# Patient Record
Sex: Male | Born: 1985 | Race: Black or African American | Hispanic: No | Marital: Single | Smoking: Never smoker
Health system: Southern US, Community
[De-identification: ages and names within clinical notes are randomized; demographics above are authoritative.]

## PROBLEM LIST (undated history)

## (undated) DIAGNOSIS — E119 Type 2 diabetes mellitus without complications: Secondary | ICD-10-CM

## (undated) DIAGNOSIS — E785 Hyperlipidemia, unspecified: Secondary | ICD-10-CM

## (undated) HISTORY — PX: ANKLE ARTHROSCOPY: SHX545

## (undated) HISTORY — PX: ANKLE FRACTURE SURGERY: SHX122

## (undated) HISTORY — PX: WISDOM TOOTH EXTRACTION: SHX21

---
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
## 2005-03-02 ENCOUNTER — Emergency Department (HOSPITAL_COMMUNITY): Admission: EM | Admit: 2005-03-02 | Discharge: 2005-03-02 | Payer: Self-pay | Admitting: Emergency Medicine

## 2011-03-01 ENCOUNTER — Encounter: Payer: Self-pay | Admitting: *Deleted

## 2011-03-01 ENCOUNTER — Emergency Department (HOSPITAL_COMMUNITY): Payer: Self-pay

## 2011-03-01 ENCOUNTER — Inpatient Hospital Stay (HOSPITAL_COMMUNITY)
Admission: EM | Admit: 2011-03-01 | Discharge: 2011-03-04 | DRG: 494 | Disposition: A | Discharge status: Home / Self Care | Payer: Self-pay | Attending: Orthopaedic Surgery | Admitting: Orthopaedic Surgery

## 2011-03-01 DIAGNOSIS — W219XXA Striking against or struck by unspecified sports equipment, initial encounter: Secondary | ICD-10-CM

## 2011-03-01 DIAGNOSIS — S8263XA Displaced fracture of lateral malleolus of unspecified fibula, initial encounter for closed fracture: Principal | ICD-10-CM | POA: Diagnosis present

## 2011-03-01 DIAGNOSIS — Y9359 Activity, other involving other sports and athletics played individually: Secondary | ICD-10-CM

## 2011-03-01 DIAGNOSIS — S93429A Sprain of deltoid ligament of unspecified ankle, initial encounter: Secondary | ICD-10-CM | POA: Diagnosis present

## 2011-03-01 DIAGNOSIS — S82891A Other fracture of right lower leg, initial encounter for closed fracture: Secondary | ICD-10-CM

## 2011-03-01 LAB — BASIC METABOLIC PANEL
BUN: 11 mg/dL (ref 6–23)
CO2: 28 mEq/L (ref 19–32)
Calcium: 9.4 mg/dL (ref 8.4–10.5)
Chloride: 100 mEq/L (ref 96–112)
Creatinine, Ser: 1.32 mg/dL (ref 0.50–1.35)
GFR calc Af Amer: 85 mL/min — ABNORMAL LOW (ref >90)
GFR calc non Af Amer: 73 mL/min — ABNORMAL LOW (ref >90)
Glucose, Bld: 118 mg/dL — ABNORMAL HIGH (ref 70–99)
Potassium: 4 mEq/L (ref 3.5–5.1)
Sodium: 138 mEq/L (ref 135–145)

## 2011-03-01 LAB — CBC
HCT: 42.8 % (ref 39.0–52.0)
Hemoglobin: 14 g/dL (ref 13.0–17.0)
MCH: 28.3 pg (ref 26.0–34.0)
MCHC: 32.7 g/dL (ref 30.0–36.0)
MCV: 86.5 fL (ref 78.0–100.0)
Platelets: 336 10*3/uL (ref 150–400)
RBC: 4.95 MIL/uL (ref 4.22–5.81)
RDW: 13.6 % (ref 11.5–15.5)
WBC: 6.7 10*3/uL (ref 4.0–10.5)

## 2011-03-01 MED ORDER — NALOXONE HCL 0.4 MG/ML IJ SOLN: 0.4000 mg | INTRAMUSCULAR | Status: DC | PRN

## 2011-03-01 MED ORDER — FENTANYL CITRATE 0.05 MG/ML IJ SOLN: 200.0000 ug | Freq: Once | INTRAMUSCULAR | Status: DC

## 2011-03-01 MED ORDER — CEFAZOLIN SODIUM 1-5 GM-% IV SOLN
1.0000 g | Freq: Once | INTRAVENOUS | Status: DC
  Filled 2011-03-01: qty 50

## 2011-03-01 MED ORDER — PROPOFOL 10 MG/ML IV EMUL
INTRAVENOUS | Status: AC
  Filled 2011-03-01: qty 100

## 2011-03-01 MED ORDER — MIDAZOLAM HCL 5 MG/5ML IJ SOLN
5.0000 mg | Freq: Once | INTRAMUSCULAR | Status: AC
  Administered 2011-03-01: 5 mg via INTRAVENOUS

## 2011-03-01 MED ORDER — SODIUM CHLORIDE 0.9 % IV SOLN
Freq: Once | INTRAVENOUS | Status: AC
  Administered 2011-03-01: 20:00:00 via INTRAVENOUS

## 2011-03-01 MED ORDER — PROPOFOL 10 MG/ML IV EMUL: 200.0000 mg | Freq: Once | INTRAVENOUS | Status: DC

## 2011-03-01 MED ORDER — ONDANSETRON HCL 4 MG/2ML IJ SOLN: 4.0000 mg | Freq: Four times a day (QID) | INTRAMUSCULAR | Status: DC | PRN

## 2011-03-01 MED ORDER — ZOLPIDEM TARTRATE 5 MG PO TABS
5.0000 mg | ORAL_TABLET | Freq: Every day | ORAL | Status: DC
  Administered 2011-03-01 – 2011-03-03 (×3): 5 mg via ORAL
  Filled 2011-03-01 (×3): qty 1

## 2011-03-01 MED ORDER — MIDAZOLAM HCL 5 MG/5ML IJ SOLN
INTRAMUSCULAR | Status: AC
  Administered 2011-03-01: 5 mg
  Filled 2011-03-01: qty 10

## 2011-03-01 MED ORDER — PROPOFOL 10 MG/ML IV EMUL
INTRAVENOUS | Status: AC
  Filled 2011-03-01: qty 20

## 2011-03-01 MED ORDER — DIPHENHYDRAMINE HCL 50 MG/ML IJ SOLN
12.5000 mg | Freq: Four times a day (QID) | INTRAMUSCULAR | Status: DC | PRN
  Administered 2011-03-02: 12.5 mg via INTRAVENOUS
  Filled 2011-03-01: qty 1

## 2011-03-01 MED ORDER — FENTANYL CITRATE 0.05 MG/ML IJ SOLN
INTRAMUSCULAR | Status: AC
  Filled 2011-03-01: qty 2

## 2011-03-01 MED ORDER — MORPHINE SULFATE (PF) 1 MG/ML IV SOLN
INTRAVENOUS | Status: DC
  Administered 2011-03-01 – 2011-03-02 (×2): 25 mg via INTRAVENOUS
  Administered 2011-03-02: 6 mg via INTRAVENOUS
  Administered 2011-03-02: 1.5 mg via INTRAVENOUS
  Administered 2011-03-03: 1 mg via INTRAVENOUS
  Administered 2011-03-03 (×2): 25 mg via INTRAVENOUS
  Administered 2011-03-03: 5 mg via INTRAVENOUS
  Administered 2011-03-03: 11 mg via INTRAVENOUS
  Administered 2011-03-03: 5 mg via INTRAVENOUS
  Administered 2011-03-03: 6 mg via INTRAVENOUS
  Administered 2011-03-04: 4 mg via INTRAVENOUS
  Administered 2011-03-04: 2 mg via INTRAVENOUS
  Filled 2011-03-01 (×4): qty 25

## 2011-03-01 MED ORDER — DEXTROSE-NACL 5-0.45 % IV SOLN
INTRAVENOUS | Status: DC
  Administered 2011-03-01 – 2011-03-03 (×4): via INTRAVENOUS

## 2011-03-01 MED ORDER — FENTANYL CITRATE 0.05 MG/ML IJ SOLN
100.0000 ug | Freq: Once | INTRAMUSCULAR | Status: AC
  Administered 2011-03-01: 100 ug via INTRAVENOUS

## 2011-03-01 MED ORDER — MORPHINE SULFATE 10 MG/ML IJ SOLN
INTRAMUSCULAR | Status: AC
  Administered 2011-03-01: 5 mg
  Filled 2011-03-01: qty 1

## 2011-03-01 MED ORDER — DIPHENHYDRAMINE HCL 12.5 MG/5ML PO ELIX
12.5000 mg | ORAL_SOLUTION | Freq: Four times a day (QID) | ORAL | Status: DC | PRN
  Administered 2011-03-02: 12.5 mg via ORAL
  Filled 2011-03-01 (×2): qty 5

## 2011-03-01 MED ORDER — SODIUM CHLORIDE 0.9 % IJ SOLN: 9.0000 mL | INTRAMUSCULAR | Status: DC | PRN

## 2011-03-01 NOTE — ED Notes (Signed)
Patient able to hold conversation with family at this time . Vitals stable

## 2011-03-01 NOTE — ED Notes (Signed)
Per EMS - pt was playing basketball and landed on his right foot - at which time pt fell causing injury to rt ankle - pt w/ obvious deformity to rt ankle.

## 2011-03-01 NOTE — ED Notes (Signed)
Pt received IM dilaudid by EMS approx [phone]

## 2011-03-01 NOTE — ED Provider Notes (Signed)
Scribed for Raeford Razor, MD, the patient was seen in room APA01/AP[facility]01 . This chart was scribed by Ellie Lunch.   CSN: [id number] Arrival date & time: 03/01/2011  7:32 PM   First MD Initiated Contact with Patient 03/01/11 1935      Chief Complaint  Patient presents with  . Ankle Injury    (Consider location/radiation/quality/duration/timing/severity/associated sxs/prior treatment) HPI Bruce [name] is a 25 y.o. male brought in by ambulance, who presents to the Emergency Department complaining of ankle injury ~ one hour ago. Pt says he was placing basketball and landed on his right foot and fell. Pt reports some associated numbness in his right foot. Pain rated 10/10 in severity. Improved with dilaudid (given by EMS).  No other medical problems  No past medical history on file.  No past surgical history on file.  No family history on file.  History  Substance Use Topics  . Smoking status: Never Smoker   . Smokeless tobacco: Not on file  . Alcohol Use: No    Review of Systems 10 Systems reviewed and are negative for acute change except as noted in the HPI.  Allergies  Review of patient's allergies indicates no known allergies.  Home Medications  No current outpatient prescriptions on file.  BP 165/85  Temp(Src) 98.4 F (36.9 C) (Oral)  Resp 24  Ht 5\' 11"  (1.803 m)  Wt 240 lb (108.863 kg)  BMI 33.47 kg/m2  SpO2 100%  Physical Exam  Nursing note and vitals reviewed. Constitutional: He is oriented to person, place, and time. He appears well-developed and well-nourished.  HENT:  Head: Normocephalic and atraumatic.  Eyes: Conjunctivae are normal. No scleral icterus.  Neck: Neck supple.  Cardiovascular: Normal rate, regular rhythm and normal heart sounds.   Pulmonary/Chest: Effort normal and breath sounds normal. No respiratory distress.  Abdominal: Soft. There is no tenderness.  Musculoskeletal:       Obvious deformity right ankle. Injury closed.  Right foot  warm with good cap refill.  No doppler dpp pulse heard.   No proximal tenderness.    Neurological: He is alert and oriented to person, place, and time.  Skin: Skin is warm and dry.  Psychiatric: He has a normal mood and affect.    ED Course  Procedures (including critical care time)  OTHER DATA REVIEWED: Nursing notes, vital signs, and past medical records reviewed.   DIAGNOSTIC STUDIES: Oxygen Saturation is 100% on room air, normal by my interpretation.    Dg Ankle Right Port  03/01/2011  *RADIOLOGY REPORT*  Clinical Data: Basketball injury  PORTABLE RIGHT ANKLE - 2 VIEW  Comparison: None  Findings: Fracture dislocation of the ankle.  The talus is dislocated posteriorly.  There is a fracture of the distal fibula which is angulated.  Suboptimal views due to deformity however no definite fracture of the medial malleolus.  IMPRESSION: Fracture dislocation of the ankle.  Original Report Authenticated By: Camelia Phenes, M.D.   7:38 PM Dislocated ankle likely fracture. Will consult orthopedic surgeon for right ankle reduction.   EDP discussed conscious sedation and associated risks.  7:55 PM EDP consult with ortho Dr. Hilda Lias. Dr. Hilda Lias request callback after xray.   1. Ankle fracture, right       MDM  25yM with Fx/Disclocation R ankle. Evluated by ortho in ED who reduced and admitted.  I personally preformed the services scribed in my presence. The recorded information has been reviewed and considered. Raeford Razor, MD.  Raeford Razor, MD 03/10/11 [phone]

## 2011-03-01 NOTE — ED Notes (Signed)
Dr Hilda Lias  Refused to sign medical consent  Or surgical consent form. Stated he never signed one before and was not signing it.

## 2011-03-01 NOTE — ED Notes (Signed)
Noted gross deformity to right ankle. No palpable  pulses noted to foot via doppler

## 2011-03-01 NOTE — H&P (Signed)
Bruce [name] is an 25 y.o. male.   Chief Complaint: I hurt my ankle playing hard basketball HPI: He was playing basketball tonight in [place] and came down on his right ankle and felt a "pop" then severe pain and deformity.  He came here instead of [facility] in [place] as he lives in [place].  He has no other injury.  He is in marked pain.  He has a closed injury.  No past medical history on file.  No past surgical history on file.  No family history on file. Social History:  reports that he has never smoked. He does not have any smokeless tobacco history on file. He reports that he does not drink alcohol or use illicit drugs.  Allergies: No Known Allergies  Medications Prior to Admission  Medication Dose Route Frequency Provider Last Rate Last Dose  . 0.9 %  sodium chloride infusion   Intravenous Once Raeford Razor, MD 100 mL/hr at 03/01/11 2018    . fentaNYL (SUBLIMAZE) 0.05 MG/ML injection 100 mcg  100 mcg Intravenous Once Raeford Razor, MD   100 mcg at 03/01/11 2030  . fentaNYL (SUBLIMAZE) 0.05 MG/ML injection 200 mcg  200 mcg Intravenous Once Raeford Razor, MD      . midazolam (VERSED) 5 MG/5ML injection 5 mg  5 mg Intravenous Once Randie Bloodgood   5 mg at 03/01/11 2045  . midazolam (VERSED) 5 MG/5ML injection        5 mg at 03/01/11 2036  . morphine 10 MG/ML injection        5 mg at 03/01/11 2037  . propofol (DIPRIVAN) 10 MG/ML infusion 200 mg  200 mg Intravenous Once Raeford Razor, MD      . DISCONTD: fentaNYL (SUBLIMAZE) 0.05 MG/ML injection           . DISCONTD: propofol (DIPRIVAN) 10 MG/ML infusion           . DISCONTD: propofol (DIPRIVAN) 10 MG/ML infusion            No current outpatient prescriptions on file as of 03/01/2011.    Results for orders placed during the hospital encounter of 03/01/11 (from the past 48 hour(s))  CBC     Status: Normal   Collection Time   03/01/11  8:07 PM      Component Value Range Comment   WBC 6.7  4.0 - 10.5 (K/uL)    RBC 4.95  4.22 - 5.81  (MIL/uL)    Hemoglobin 14.0  13.0 - 17.0 (g/dL)    HCT 16.1  09.6 - 04.5 (%)    MCV 86.5  78.0 - 100.0 (fL)    MCH 28.3  26.0 - 34.0 (pg)    MCHC 32.7  30.0 - 36.0 (g/dL)    RDW 40.9  81.1 - 91.4 (%)    Platelets 336  150 - 400 (K/uL)    Dg Ankle Right Port  03/01/2011  *RADIOLOGY REPORT*  Clinical Data: Basketball injury  PORTABLE RIGHT ANKLE - 2 VIEW  Comparison: None  Findings: Fracture dislocation of the ankle.  The talus is dislocated posteriorly.  There is a fracture of the distal fibula which is angulated.  Suboptimal views due to deformity however no definite fracture of the medial malleolus.  IMPRESSION: Fracture dislocation of the ankle.  Original Report Authenticated By: Camelia Phenes, M.D.    Review of Systems  Constitutional: Negative.   HENT: Negative.   Eyes: Negative.   Respiratory: Negative.   Cardiovascular: Negative.   Gastrointestinal:  Negative.   Genitourinary: Negative.   Musculoskeletal: Positive for joint pain (severe pain right ankle tonight after playing basketball, no prior problems).  Skin: Negative.   Neurological: Negative.   Endo/Heme/Allergies: Negative.   Psychiatric/Behavioral: Negative.     Blood pressure 146/82, pulse 100, temperature 98.4 F (36.9 C), temperature source Oral, resp. rate 16, height 5\' 11"  (1.803 m), weight 108.863 kg (240 lb), SpO2 96.00%. Physical Exam  Constitutional: He is oriented to person, place, and time. He appears well-developed and well-nourished.  HENT:  Head: Normocephalic and atraumatic.  Eyes: Conjunctivae and EOM are normal. Pupils are equal, round, and reactive to light.  Neck: Normal range of motion. Neck supple.  Cardiovascular: Normal rate, regular rhythm, normal heart sounds and intact distal pulses.   Respiratory: Effort normal and breath sounds normal.  GI: Soft. Bowel sounds are normal.  Musculoskeletal: He exhibits tenderness (right ankle).       Right ankle: He exhibits decreased range of motion,  swelling, ecchymosis, deformity and abnormal pulse. tenderness. Lateral malleolus tenderness found.       Feet:  Neurological: He is alert and oriented to person, place, and time. He has normal reflexes.  Skin: Skin is warm and dry.  Psychiatric: He has a normal mood and affect. His behavior is normal. Judgment and thought content normal.     Assessment/Plan Fracture/dislocation of the right ankle with obvious deformity.  I gave him 5 mgm of Morphine IV and 10 mgm of Versed and then did a closed reduction of the right ankle.  He was aware of what needed to be done.  I have also told him he will need open treatment and internal fixation of the ankle tomorrow or the following day. He understood.  I went over this information before giving him any medication.  Closed reduction x-rays are pending.  Vinicius Brockman 03/01/2011, 8:47 PM

## 2011-03-01 NOTE — ED Notes (Signed)
Pt given meds and right ankle reduced by dr. Hilda Lias, pt tolerated well, snoring resp. vss.

## 2011-03-01 NOTE — ED Notes (Signed)
Alert alert, will respond to verbal stimuli, remains on all monitors, dr.keeling placed splint, awaiting xray for films.

## 2011-03-02 ENCOUNTER — Encounter (HOSPITAL_COMMUNITY): Payer: Self-pay | Admitting: Anesthesiology

## 2011-03-02 ENCOUNTER — Inpatient Hospital Stay (HOSPITAL_COMMUNITY): Payer: Self-pay | Admitting: Anesthesiology

## 2011-03-02 ENCOUNTER — Encounter (HOSPITAL_COMMUNITY): Payer: Self-pay | Admitting: *Deleted

## 2011-03-02 ENCOUNTER — Encounter (HOSPITAL_COMMUNITY)
Admission: EM | Disposition: A | Discharge status: Home / Self Care | Payer: Self-pay | Source: Home / Self Care | Attending: Orthopaedic Surgery

## 2011-03-02 ENCOUNTER — Inpatient Hospital Stay (HOSPITAL_COMMUNITY): Payer: Self-pay

## 2011-03-02 HISTORY — PX: ORIF ANKLE FRACTURE: SHX5408

## 2011-03-02 SURGERY — OPEN REDUCTION INTERNAL FIXATION (ORIF) ANKLE FRACTURE
Anesthesia: General | Site: Ankle | Laterality: Right | Wound class: Clean

## 2011-03-02 MED ORDER — LIDOCAINE HCL (PF) 1 % IJ SOLN
INTRAMUSCULAR | Status: AC
  Filled 2011-03-02: qty 5

## 2011-03-02 MED ORDER — SODIUM CHLORIDE 0.9 % IR SOLN
Status: DC | PRN
  Administered 2011-03-02: 1000 mL

## 2011-03-02 MED ORDER — ONDANSETRON HCL 4 MG/2ML IJ SOLN
INTRAMUSCULAR | Status: AC
  Filled 2011-03-02: qty 2

## 2011-03-02 MED ORDER — ENOXAPARIN SODIUM 40 MG/0.4ML ~~LOC~~ SOLN
40.0000 mg | SUBCUTANEOUS | Status: DC
  Administered 2011-03-03 – 2011-03-04 (×2): 40 mg via SUBCUTANEOUS
  Filled 2011-03-02 (×2): qty 0.4

## 2011-03-02 MED ORDER — MAGNESIUM HYDROXIDE 400 MG/5ML PO SUSP: 30.0000 mL | Freq: Every day | ORAL | Status: DC | PRN

## 2011-03-02 MED ORDER — PROMETHAZINE HCL 25 MG/ML IJ SOLN: 12.5000 mg | INTRAMUSCULAR | Status: DC | PRN

## 2011-03-02 MED ORDER — FENTANYL CITRATE 0.05 MG/ML IJ SOLN
25.0000 ug | INTRAMUSCULAR | Status: DC | PRN
  Administered 2011-03-02 (×2): 50 ug via INTRAVENOUS

## 2011-03-02 MED ORDER — FENTANYL CITRATE 0.05 MG/ML IJ SOLN
INTRAMUSCULAR | Status: DC | PRN
  Administered 2011-03-02: 50 ug via INTRAVENOUS
  Administered 2011-03-02: 25 ug via INTRAVENOUS
  Administered 2011-03-02: 50 ug via INTRAVENOUS
  Administered 2011-03-02: 25 ug via INTRAVENOUS

## 2011-03-02 MED ORDER — FENTANYL CITRATE 0.05 MG/ML IJ SOLN
INTRAMUSCULAR | Status: AC
  Administered 2011-03-02: 50 ug via INTRAVENOUS
  Filled 2011-03-02: qty 2

## 2011-03-02 MED ORDER — CEFAZOLIN SODIUM 1-5 GM-% IV SOLN
INTRAVENOUS | Status: DC | PRN
  Administered 2011-03-02: 1 g via INTRAVENOUS

## 2011-03-02 MED ORDER — MIDAZOLAM HCL 2 MG/2ML IJ SOLN
INTRAMUSCULAR | Status: AC
  Filled 2011-03-02: qty 2

## 2011-03-02 MED ORDER — LACTATED RINGERS IV SOLN
INTRAVENOUS | Status: DC
  Administered 2011-03-02 (×2): via INTRAVENOUS

## 2011-03-02 MED ORDER — ONDANSETRON HCL 4 MG/2ML IJ SOLN: 4.0000 mg | Freq: Once | INTRAMUSCULAR | Status: DC | PRN

## 2011-03-02 MED ORDER — ACETAMINOPHEN 325 MG PO TABS
650.0000 mg | ORAL_TABLET | Freq: Four times a day (QID) | ORAL | Status: DC | PRN
  Administered 2011-03-02: 650 mg via ORAL
  Filled 2011-03-02: qty 2

## 2011-03-02 MED ORDER — PROPOFOL 10 MG/ML IV EMUL
INTRAVENOUS | Status: DC | PRN
  Administered 2011-03-02: 70 mg via INTRAVENOUS
  Administered 2011-03-02: 180 mg via INTRAVENOUS

## 2011-03-02 MED ORDER — PROPOFOL 10 MG/ML IV EMUL
INTRAVENOUS | Status: AC
  Filled 2011-03-02: qty 20

## 2011-03-02 MED ORDER — FENTANYL CITRATE 0.05 MG/ML IJ SOLN
INTRAMUSCULAR | Status: AC
  Filled 2011-03-02: qty 2

## 2011-03-02 MED ORDER — ONDANSETRON HCL 4 MG/2ML IJ SOLN
4.0000 mg | Freq: Once | INTRAMUSCULAR | Status: AC
  Administered 2011-03-02: 4 mg via INTRAVENOUS

## 2011-03-02 MED ORDER — MIDAZOLAM HCL 5 MG/5ML IJ SOLN
INTRAMUSCULAR | Status: DC | PRN
  Administered 2011-03-02: 2 mg via INTRAVENOUS

## 2011-03-02 MED ORDER — MIDAZOLAM HCL 2 MG/2ML IJ SOLN
1.0000 mg | INTRAMUSCULAR | Status: DC | PRN
  Administered 2011-03-02: 2 mg via INTRAVENOUS

## 2011-03-02 SURGICAL SUPPLY — 59 items
BAG HAMPER (MISCELLANEOUS) ×2 IMPLANT
BANDAGE ELASTIC 4 VELCRO NS (GAUZE/BANDAGES/DRESSINGS) ×2 IMPLANT
BANDAGE ELASTIC 4 VELCRO ST LF (GAUZE/BANDAGES/DRESSINGS) ×2 IMPLANT
BANDAGE ELASTIC 6 VELCRO NS (GAUZE/BANDAGES/DRESSINGS) ×2 IMPLANT
BANDAGE ELASTIC 6 VELCRO ST LF (GAUZE/BANDAGES/DRESSINGS) ×4 IMPLANT
BANDAGE ESMARK 4X12 BL STRL LF (DISPOSABLE) ×1 IMPLANT
BIT DRILL 2.5X110 QC LCP DISP (BIT) ×2 IMPLANT
BIT DRILL 2.8 (BIT) ×1
BIT DRILL CANN QC 2.8X165 (BIT) ×1 IMPLANT
BIT DRILL JC END 3.2X130 (BIT) IMPLANT
BLADE SURG 15 STRL LF DISP TIS (BLADE) ×1 IMPLANT
BLADE SURG 15 STRL SS (BLADE) ×1
BNDG ESMARK 4X12 BLUE STRL LF (DISPOSABLE) ×2
CLOTH BEACON ORANGE TIMEOUT ST (SAFETY) ×2 IMPLANT
COVER LIGHT HANDLE STERIS (MISCELLANEOUS) ×4 IMPLANT
COVER MAYO STAND XLG (DRAPE) ×2 IMPLANT
CUFF TOURNIQUET SINGLE 34IN LL (TOURNIQUET CUFF) ×2 IMPLANT
DRAPE C-ARM FOLDED MOBILE STRL (DRAPES) ×2 IMPLANT
DRILL BIT 2.8MM (BIT) ×1
GAUZE XEROFORM 5X9 LF (GAUZE/BANDAGES/DRESSINGS) ×2 IMPLANT
GLOVE BIO SURGEON STRL SZ8 (GLOVE) ×2 IMPLANT
GLOVE BIO SURGEON STRL SZ8.5 (GLOVE) ×2 IMPLANT
GLOVE ECLIPSE 6.5 STRL STRAW (GLOVE) ×2 IMPLANT
GLOVE ECLIPSE 7.0 STRL STRAW (GLOVE) ×2 IMPLANT
GLOVE EXAM NITRILE XS STR PU (GLOVE) ×2 IMPLANT
GLOVE INDICATOR 7.0 STRL GRN (GLOVE) ×4 IMPLANT
GOWN BRE IMP SLV AUR XL STRL (GOWN DISPOSABLE) ×4 IMPLANT
GOWN STRL REIN XL XLG (GOWN DISPOSABLE) ×2 IMPLANT
INST SET MINOR BONE (KITS) ×2 IMPLANT
K-WIRE 229MX1.6 (WIRE) IMPLANT
KIT ROOM TURNOVER APOR (KITS) ×2 IMPLANT
MANIFOLD NEPTUNE II (INSTRUMENTS) ×2 IMPLANT
NS IRRIG 1000ML POUR BTL (IV SOLUTION) ×2 IMPLANT
PACK BASIC LIMB (CUSTOM PROCEDURE TRAY) ×2 IMPLANT
PAD ABD 5X9 TENDERSORB (GAUZE/BANDAGES/DRESSINGS) ×6 IMPLANT
PAD ARMBOARD 7.5X6 YLW CONV (MISCELLANEOUS) ×2 IMPLANT
PAD CAST 4YDX4 CTTN HI CHSV (CAST SUPPLIES) ×1 IMPLANT
PADDING CAST COTTON 4X4 STRL (CAST SUPPLIES) ×1
PADDING CAST COTTON 6X4 STRL (CAST SUPPLIES) ×2 IMPLANT
PLATE LCP 3.5 1/3 TUB 8HX93 (Plate) ×2 IMPLANT
SCREW CORTEX 3.5 16MM (Screw) ×4 IMPLANT
SCREW CORTEX 3.5 20MM (Screw) ×1 IMPLANT
SCREW CORTEX 3.5 55MM (Screw) ×2 IMPLANT
SCREW LOCK CORT ST 3.5X16 (Screw) ×4 IMPLANT
SCREW LOCK CORT ST 3.5X20 (Screw) ×1 IMPLANT
SCREW LOCK T15 FT 16X3.5X2.9X (Screw) ×1 IMPLANT
SCREW LOCKING 3.5X16 (Screw) ×1 IMPLANT
SET BASIN LINEN APH (SET/KITS/TRAYS/PACK) ×2 IMPLANT
SOL PREP PROV IODINE SCRUB 4OZ (MISCELLANEOUS) ×2 IMPLANT
SPLINT J 5 (CAST SUPPLIES) IMPLANT
SPLINT J 6 (CAST SUPPLIES) ×2 IMPLANT
SPONGE GAUZE 4X4 12PLY (GAUZE/BANDAGES/DRESSINGS) ×2 IMPLANT
SPONGE LAP 18X18 X RAY DECT (DISPOSABLE) ×2 IMPLANT
SPONGE LAP 4X18 X RAY DECT (DISPOSABLE) ×2 IMPLANT
STAPLER VISISTAT 35W (STAPLE) IMPLANT
SUT CHROMIC 2 0 CT 1 (SUTURE) ×2 IMPLANT
SUT NUROLON CT 2 BLK #1 18IN (SUTURE) ×4 IMPLANT
SUT PLAIN 2 0 XLH (SUTURE) ×4 IMPLANT
TOWEL OR 17X26 4PK STRL BLUE (TOWEL DISPOSABLE) ×2 IMPLANT

## 2011-03-02 NOTE — Anesthesia Procedure Notes (Addendum)
Procedure Name: LMA Insertion Date/Time: 03/02/2011 9:46 AM Performed by: Despina Hidden Pre-anesthesia Checklist: Patient identified, Patient being monitored, Timeout performed, Emergency Drugs available and Suction available Patient Re-evaluated:Patient Re-evaluated prior to inductionOxygen Delivery Method: Circle System Utilized Preoxygenation: Pre-oxygenation with 100% oxygen Intubation Type: IV induction Ventilation: Mask ventilation without difficulty LMA: LMA inserted LMA Size: 4.0 Number of attempts: 1 Placement Confirmation: positive ETCO2 and breath sounds checked- equal and bilateral Dental Injury: Teeth and Oropharynx as per pre-operative assessment

## 2011-03-02 NOTE — Brief Op Note (Signed)
03/01/2011 - 03/02/2011  11:03 AM  PATIENT:  Bruce [name]  25 y.o. male  PRE-OPERATIVE DIAGNOSIS:  rt ankle dislocation with fracture lateral malleolus and disruption of the medial deltoid ligament and the syndesmosis ligament between fibula and tibia distally  POST-OPERATIVE DIAGNOSIS:  rt ankle dislocation  PROCEDURE:  Procedure(s): OPEN REDUCTION INTERNAL FIXATION (ORIF) ANKLE FRACTURE with repair of the deltoid ligament medially and fixation of the lateral malleolus and placement of syndesmosis screw, STAGED PROCEDURE  SURGEON:  Surgeon(s): [employer]  PHYSICIAN ASSISTANT:   ASSISTANTS: none   ANESTHESIA:   general  EBL:  Total I/O In: 1000 [I.V.:1000] Out: -   BLOOD ADMINISTERED:none  DRAINS: none   LOCAL MEDICATIONS USED:  NONE  SPECIMEN:  No Specimen  DISPOSITION OF SPECIMEN:  N/A  COUNTS:  YES  TOURNIQUET:   Total Tourniquet Time Documented: Thigh (Right) - 54 minutes  DICTATION: .Other Dictation: Dictation Number [id number]  PLAN OF CARE: Admit to inpatient   PATIENT DISPOSITION:  PACU - hemodynamically stable.   Delay start of Pharmacological VTE agent (>24hrs) due to surgical blood loss or risk of bleeding:  no

## 2011-03-02 NOTE — Anesthesia Preprocedure Evaluation (Addendum)
Anesthesia Evaluation  Patient identified by MRN, date of birth, ID band Patient awake  General Assessment Comment  Reviewed: Allergy & Precautions, H&P , NPO status , Patient's Chart, lab work & pertinent test results  Airway Mallampati: I      Dental  (+) Teeth Intact   Pulmonary  clear to auscultation        Cardiovascular Regular Normal    Neuro/Psych    GI/Hepatic   Endo/Other    Renal/GU      Musculoskeletal   Abdominal   Peds  Hematology   Anesthesia Other Findings   Reproductive/Obstetrics                           Anesthesia Physical Anesthesia Plan  ASA: I  Anesthesia Plan: General   Post-op Pain Management:    Induction: Intravenous  Airway Management Planned: LMA  Additional Equipment:   Intra-op Plan:   Post-operative Plan: Extubation in OR  Informed Consent: I have reviewed the patients History and Physical, chart, labs and discussed the procedure including the risks, benefits and alternatives for the proposed anesthesia with the patient or authorized representative who has indicated his/her understanding and acceptance.     Plan Discussed with:   Anesthesia Plan Comments:         Anesthesia Quick Evaluation

## 2011-03-02 NOTE — Transfer of Care (Signed)
Immediate Anesthesia Transfer of Care Note  Patient: Bruce [name]  Procedure(s) Performed:  OPEN REDUCTION INTERNAL FIXATION (ORIF) ANKLE FRACTURE  Patient Location: PACU  Anesthesia Type: General  Level of Consciousness: awake, alert , oriented and patient cooperative  Airway & Oxygen Therapy: Patient Spontanous Breathing and Patient connected to face mask oxygen  Post-op Assessment: Report given to PACU RN, Post -op Vital signs reviewed and stable, Patient moving all extremities and Patient moving all extremities X 4  Post vital signs: Reviewed and stable  Complications: No apparent anesthesia complications

## 2011-03-02 NOTE — Plan of Care (Signed)
Problem: Phase I Progression Outcomes Goal: Pain controlled with appropriate interventions Outcome: Completed/Met Date Met:  03/02/11 Morphine PCA

## 2011-03-02 NOTE — Progress Notes (Signed)
The History and Physical is unchanged. I have examined the patient. The patient is medically able to have surgery on the right ankle . Bruce [name] 

## 2011-03-02 NOTE — OR Nursing (Signed)
DISCARDED 800CC   d5 1/2, REPLACED WITH 1000CC lr

## 2011-03-02 NOTE — Op Note (Signed)
NAME:  Bruce [name], Bruce [name] NO.:  [id number]  MEDICAL RECORD NO.:  [id number]  LOCATION:  A316                          FACILITY:  [facility]  PHYSICIAN:  J. Darreld Mclean, M.D. DATE OF BIRTH:  23-Sep-1985  DATE OF PROCEDURE:  03/02/2011 DATE OF DISCHARGE:                              OPERATIVE REPORT   PREOPERATIVE DIAGNOSES:  Status post  dislocation of the right ankle with disruption of the deltoid ligament medially, the syndesmosis ligament between the tibia and fibula and lateral malleolar and distal fibula fracture.  POSTOPERATIVE DIAGNOSES:  Status post  dislocation of the right ankle with disruption of the deltoid ligament medially, the syndesmosis ligament between the tibia and fibula and lateral malleolar and distal fibula fracture.  PROCEDURE:  Open treatment and internal reduction of right ankle with repair of the deltoid ligament medially, placement of 8-hole lateral malleolar screw with syndesmosis screw.  This is a staged procedure.  He will need to come back in several weeks for removal of the syndesmosis screw.  ANESTHESIA:  General.  TOURNIQUET TIME:  55 minutes.  DRAINS:  None.  Posterior splint applied at the end of the procedure.  SURGEON:  J. Darreld Mclean, MD  INDICATIONS:  The patient is a 25 year old male who injured his ankle last night.  I saw him in the ER and did a closed reduction of the ankle dislocation last night.  I talked to him about coming to surgery today. I went over the risks and imponderables including infection, nerve injury, strong possibility to develop overtime some traumatic arthritis of the ankle.  I explained to him about the placement of the syndesmosis screw and the need to stand for approximately 8 weeks and he have to come back for surgery and remove the screw before he starts bearing weight.  I talked about physical therapy after that.  I talked to him about anesthesia and recommended general anesthesia.  He  asked appropriate questions and appeared to understand the procedure as outlined.  DESCRIPTION OF PROCEDURE:  The patient was seen in the holding area. His mother was also present.  I had them ask questions which they did. The site was identified in the holding area as the right ankle and I placed a mark on the right ankle.  The patient was brought to the operating room, placed supine, and given general anesthesia.  Tourniquet placed and deflated, right upper thigh.  Posterior splint was removed and he was prepped and draped in the usual manner.  We had a generalized time-out identifying the patient as Bruce [name] and his right ankle.  All people in the operating room knew each other.  C- arm fluoroscopy unit was in place.  Everyone had lead aprons, thyroid shields, and appropriate radiology indicators.  C-arm fluoroscopy unit was working properly.  The leg was then elevated up circumferentially with Esmarch bandage, tourniquet inflated to 350 mmHg, and Esmarch bandage removed.  Incision was made medially, the joint was exposed, lifted the talus but I did not see an obvious fracture.  I debrided the hematoma.  The deep fibers of the deltoid ligament were torn and also the ligament somewhat anteriorly with the talus and  tibia joint, and I used 0-Surgilon suture and I placed sutures in figure-of-eight fashion, approximately 10 of them around to repair the ligament.  Good repair was obtained as I tied the sutures at this time, however.  I then went laterally, exposed the fracture, cleaned the hematoma, had reduction, and placed an 8-hole sideplate.  Screws were cortical except for one locking screw, the fourth screw from proximal did not have anything to place and excess over the fracture site.  C-arm fluoroscopy unit was used.  Screws generally measured around 16 mm, there was 120 mm, there was 155 mm for the syndesmosis screw, and a locking screw of 16. Primary x-rays were taken and these  looked good.  The ankle mortise was reduced.  I went over to the medial side and tied the previously applied sutures to the ligament sutured in place and the wounds were reapproximated with 2-0 plain subcutaneous layer and skin staples. Likewise, the repair was done on the lateral side.  Tourniquet deflated after 53 minutes.  Bulky dressing applied, sterile dressing applied, and then a posterior splint applied.  He tolerated the procedure well and will go to recovery in good condition.  He will be admitted for pain control and then physical therapy.          ______________________________ J. Darreld Mclean, M.D.     JWK/MEDQ  D:  03/02/2011  T:  03/02/2011  Job:  [id number]

## 2011-03-02 NOTE — Anesthesia Postprocedure Evaluation (Signed)
  Anesthesia Post-op Note  Patient: Gene Glazebrook  Procedure(s) Performed:  OPEN REDUCTION INTERNAL FIXATION (ORIF) ANKLE FRACTURE  Patient Location: PACU  Anesthesia Type: General  Level of Consciousness: awake, alert , oriented and patient cooperative  Airway and Oxygen Therapy: Patient Spontanous Breathing  Post-op Pain: 3 /10, mild  Post-op Assessment: Post-op Vital signs reviewed, Patient's Cardiovascular Status Stable, Respiratory Function Stable, Patent Airway and No signs of Nausea or vomiting  Post-op Vital Signs: Reviewed and stable  Complications: No apparent anesthesia complications

## 2011-03-02 NOTE — OR Nursing (Signed)
Voided 250 cc yellow   urine 

## 2011-03-03 LAB — CBC
MCH: 28.6 pg (ref 26.0–34.0)
Platelets: 338 10*3/uL (ref 150–400)
RBC: 4.9 MIL/uL (ref 4.22–5.81)
WBC: 7.9 10*3/uL (ref 4.0–10.5)

## 2011-03-03 LAB — BASIC METABOLIC PANEL
Calcium: 9.4 mg/dL (ref 8.4–10.5)
GFR calc non Af Amer: 78 mL/min — ABNORMAL LOW (ref >90)
Sodium: 136 mEq/L (ref 135–145)

## 2011-03-03 LAB — DIFFERENTIAL
Eosinophils Absolute: 0 10*3/uL (ref 0.0–0.7)
Lymphocytes Relative: 23 % (ref 12–46)
Lymphs Abs: 1.8 10*3/uL (ref 0.7–4.0)
Neutrophils Relative %: 64 % (ref 43–77)

## 2011-03-03 MED ORDER — ALBUTEROL SULFATE (5 MG/ML) 0.5% IN NEBU: 2.5000 mg | INHALATION_SOLUTION | RESPIRATORY_TRACT | Status: DC | PRN

## 2011-03-03 MED ORDER — ALBUTEROL SULFATE (5 MG/ML) 0.5% IN NEBU
2.5000 mg | INHALATION_SOLUTION | Freq: Four times a day (QID) | RESPIRATORY_TRACT | Status: DC
  Administered 2011-03-03 (×2): 2.5 mg via RESPIRATORY_TRACT
  Filled 2011-03-03 (×2): qty 0.5

## 2011-03-03 NOTE — Anesthesia Postprocedure Evaluation (Signed)
  Anesthesia Post-op Note  Patient: Bruce [name]  Procedure(s) Performed:  OPEN REDUCTION INTERNAL FIXATION (ORIF) ANKLE FRACTURE  Patient Location: room 316  Anesthesia Type: General  Level of Consciousness: awake, alert , oriented and patient cooperative  Airway and Oxygen Therapy: Patient Spontanous Breathing  Post-op Pain: 2 /10, mild  Post-op Assessment: Post-op Vital signs reviewed, Patient's Cardiovascular Status Stable, Respiratory Function Stable, Patent Airway, No signs of Nausea or vomiting, Adequate PO intake and Pain level controlled  Post-op Vital Signs: Reviewed and stable  Complications: No apparent anesthesia complications

## 2011-03-03 NOTE — Progress Notes (Signed)
Tmax was 101.2.  He is afebrile now.  WBC count not elevated.  Labs normal.  NV is intact.  Pain controlled.  He has no complaints.  To begin physical therapy today.  I will add respiratory therapy and nebulizer treatments today.

## 2011-03-03 NOTE — Addendum Note (Signed)
Addendum  created 03/03/11 1125 by Despina Hidden   Modules edited:Notes Section

## 2011-03-03 NOTE — Progress Notes (Signed)
Physical Therapy Evaluation Patient Details Name: Bruce [name] MRN: [id number] DOB: 1986/03/15 Today's Date: 03/03/2011  Problem List: There is no problem list on file for this patient.   Past Medical History: History reviewed. No pertinent past medical history. Past Surgical History:  Past Surgical History  Procedure Date  . Wisdom tooth extraction     PT Assessment/Plan/Recommendation PT Assessment Clinical Impression Statement: very pleasant and cooperative pt, fitted with crutches and instructed in R TTWB gait on level...needs min assist to maintain gait safety due to occaional instability ...have recommended that he stay with his Mom after d/c as she has no steps at home and he will also have more assist from her than a roommate...  will plan on seeing 2x/day and instruct in stair climbing in AM          PT Recommendation/Assessment: Patient will need skilled PT in the acute care venue PT Problem List: Decreased knowledge of use of DME;Decreased safety awareness;Decreased knowledge of precautions;Pain;Decreased activity tolerance;Decreased mobility Barriers to Discharge: None PT Plan PT Frequency: Min 6X/week PT Treatment/Interventions: DME instruction;Gait training;Stair training;Patient/family education PT Recommendation Follow Up Recommendations: None Equipment Recommended: None recommended by PT PT Goals  Acute Rehab PT Goals PT Goal Formulation: With patient Time For Goal Achievement: 7 days Pt will go Supine/Side to Sit: with modified independence Pt will go Sit to Supine/Side: with modified independence;with mod assist Pt will Ambulate: 16 - 50 feet Pt will Go Up / Down Stairs: 3-5 stairs;with supervision;with crutches;with rail(s)  PT Evaluation Precautions/Restrictions  Precautions Precautions: Fall Required Braces or Orthoses: No Restrictions Weight Bearing Restrictions: Yes RLE Weight Bearing: Touchdown weight bearing Prior Functioning  Home Living Lives  With: Friend(s);Family (will stay with Mom at d/c) Receives Help From: Family Type of Home: House Home Layout: One level Home Access: Level entry Bathroom Shower/Tub: Engineer, manufacturing systems: Standard Bathroom Accessibility: Yes Home Adaptive Equipment: None Prior Function Level of Independence: Independent with basic ADLs;Independent with gait;Independent with homemaking with ambulation;Independent with transfers Driving: Yes Vocation: Student Cognition Cognition Arousal/Alertness: Awake/alert Overall Cognitive Status: Appears within functional limits for tasks assessed Orientation Level: Oriented X4 Sensation/Coordination Sensation Light Touch: Appears Intact Stereognosis: Not tested Hot/Cold: Not tested Proprioception: Not tested Extremity Assessment RUE Assessment RUE Assessment: Within Functional Limits LUE Assessment LUE Assessment: Within Functional Limits RLE Assessment RLE Assessment: Not tested LLE Assessment LLE Assessment: Within Functional Limits Mobility (including Balance) Bed Mobility Bed Mobility: Yes Supine to Sit: 4: Min assist Supine to Sit Details (indicate cue type and reason): to assist RLE...cues for using hip musculature to do R SLR Sitting - Scoot to Edge of Bed: 6: Modified independent (Device/Increase time) Transfers Transfers: Yes Sit to Stand: 6: Modified independent (Device/Increase time) Stand to Sit: 6: Modified independent (Device/Increase time) Ambulation/Gait Ambulation/Gait: Yes Ambulation/Gait Assistance: 4: Min assist Ambulation/Gait Assistance Details (indicate cue type and reason): instruction for correct gait technique...mostly does NWB gait R Ambulation Distance (Feet): 40 Feet Assistive device: Crutches Gait Pattern: Step-through pattern;Within Functional Limits Stairs: No Wheelchair Mobility Wheelchair Mobility: No  Posture/Postural Control Posture/Postural Control: No significant limitations Balance Balance  Assessed: No Exercise    End of Session PT - End of Session Equipment Utilized During Treatment: Gait belt Activity Tolerance: Patient tolerated treatment well Patient left: in bed;with call bell in reach General Behavior During Session: [facility] for tasks performed Cognition: [facility] for tasks performed  Konrad Penta 03/03/2011, 11:05 AM

## 2011-03-03 NOTE — Progress Notes (Signed)
Physical Therapy Treatment Patient Details Name: Bruce [name] MRN: [id number] DOB: 05/11/1985 Today's Date: 03/03/2011  PT Assessment/Plan  PT - Assessment/Plan Comments on Treatment Session: pt more fatigued..received instruction in correct crutch technique and TTWB gait...endurance is limited but should be able to instruct on steps in AM PT Plan: Discharge plan remains appropriate PT Frequency: Min 6X/week Follow Up Recommendations: None Equipment Recommended: None recommended by PT PT Goals  Acute Rehab PT Goals PT Goal Formulation: With patient Time For Goal Achievement: 7 days Pt will go Supine/Side to Sit: with modified independence Pt will go Sit to Supine/Side: with modified independence;with mod assist Pt will Ambulate: 16 - 50 feet PT Goal: Ambulate - Progress: Progressing toward goal Pt will Go Up / Down Stairs: 3-5 stairs;with supervision;with crutches;with rail(s)  PT Treatment Precautions/Restrictions  Precautions Precautions: Fall Required Braces or Orthoses: No Restrictions Weight Bearing Restrictions: Yes RLE Weight Bearing: Touchdown weight bearing Mobility (including Balance) Bed Mobility Bed Mobility: Yes Supine to Sit: 4: Min assist Supine to Sit Details (indicate cue type and reason): to assist RLE...cues for using hip musculature to do R SLR Sitting - Scoot to Edge of Bed: 6: Modified independent (Device/Increase time) Transfers Transfers: Yes Sit to Stand: 5: Supervision Stand to Sit: 5: Supervision Ambulation/Gait Ambulation/Gait: Yes Ambulation/Gait Assistance: 4: Min assist Ambulation/Gait Assistance Details (indicate cue type and reason): instruction for correct gait technique...mostly does NWB gait R Ambulation Distance (Feet): 20 Feet Assistive device: Crutches Gait Pattern:  (unable to walk TTWB-does NWB R) Stairs: No Wheelchair Mobility Wheelchair Mobility: No  Posture/Postural Control Posture/Postural Control: No significant  limitations Balance Balance Assessed: No Exercise    End of Session PT - End of Session Equipment Utilized During Treatment: Gait belt Activity Tolerance: Patient limited by fatigue Patient left: in bed;with call bell in reach General Behavior During Session: [facility] for tasks performed Cognition: [facility] for tasks performed  Konrad Penta 03/03/2011, 2:40 PM

## 2011-03-04 LAB — CBC
Hemoglobin: 13 g/dL (ref 13.0–17.0)
MCH: 28.1 pg (ref 26.0–34.0)
RBC: 4.62 MIL/uL (ref 4.22–5.81)
WBC: 7.9 10*3/uL (ref 4.0–10.5)

## 2011-03-04 LAB — DIFFERENTIAL
Lymphocytes Relative: 27 % (ref 12–46)
Lymphs Abs: 2.1 10*3/uL (ref 0.7–4.0)
Monocytes Relative: 14 % — ABNORMAL HIGH (ref 3–12)
Neutro Abs: 4.7 10*3/uL (ref 1.7–7.7)
Neutrophils Relative %: 59 % (ref 43–77)

## 2011-03-04 LAB — BASIC METABOLIC PANEL
GFR calc non Af Amer: 67 mL/min — ABNORMAL LOW (ref >90)
Glucose, Bld: 126 mg/dL — ABNORMAL HIGH (ref 70–99)
Potassium: 4.1 mEq/L (ref 3.5–5.1)
Sodium: 136 mEq/L (ref 135–145)

## 2011-03-04 MED ORDER — DOXYCYCLINE HYCLATE 100 MG PO TABS
100.0000 mg | ORAL_TABLET | Freq: Two times a day (BID) | ORAL | Status: DC
  Administered 2011-03-04: 100 mg via ORAL
  Filled 2011-03-04: qty 1

## 2011-03-04 MED ORDER — OXYCODONE-ACETAMINOPHEN 5-325 MG PO TABS
1.0000 | ORAL_TABLET | ORAL | Status: DC | PRN
  Administered 2011-03-04 (×2): 1 via ORAL
  Filled 2011-03-04 (×2): qty 1

## 2011-03-04 MED ORDER — DOXYCYCLINE HYCLATE 100 MG PO TABS: 100.0000 mg | ORAL_TABLET | Freq: Two times a day (BID) | ORAL | Status: AC

## 2011-03-04 MED ORDER — OXYCODONE-ACETAMINOPHEN 5-325 MG PO TABS: 1.0000 | ORAL_TABLET | ORAL | Status: AC | PRN

## 2011-03-04 NOTE — Discharge Summary (Signed)
Physician Discharge Summary  Patient ID: Bruce [name] MRN: [id number] DOB/AGE: 25-Nov-1985 25 y.o.  Admit date: 03/01/2011 Discharge date: 03/04/2011  Admission Diagnoses: Dislocation of the right ankle with fracture of the lateral malleolus and disruption of the syndesmosis ligament and deltoid ligament   Discharge Diagnoses: Dislocation of the right ankle with fracture of the lateral malleolus and disruption of the syndesmosis ligament and deltoid ligament Active Problems:  * No active hospital problems. *    [name] Condition: good  Hospital Course: Bruce [name] was seen initially in the emergency room.  Under conscious sedation, Bruce [name] had reduction of the posterior lateral dislocation of the right ankle and tolerated it well.  A posterior splint was applied.  Bruce [name] was taken to surgery the following day and had open treatment and internal reduction of the distal fibula/lateral malleolus fracture, placement of a syndesmosis screw and repair of the deltoid ligament medially.  Bruce [name] will need to come back to surgery in about eight weeks for removal of the syndesmosis screw.  Bruce [name] was placed in a posterior splint.  Bruce [name] tolerated the procedure well.  Bruce [name] had a general anesthetic.  Bruce [name] had pain controlled by PCA pump.  Bruce [name] was seen by physical therapy and at the time of discharge was doing well with crutches.  Bruce [name] is to be toe touch only on the right and no weight bearing otherwise.  Bruce [name] is to elevate the foot and use ice.  Bruce [name] had a temperature spike the first night after surgery and was treated with respiratory therapy. Bruce [name] will be [name] on doxycyline and percocet 5 for pain.  Instructions have been given.  Bruce [name] had no other injury and no other complaints.  Consults: none  Significant Diagnostic Studies: radiology: ankle xrays in ER and post reduction and after surgery.  Treatments: surgery: Reduction of the dislocation of the ankle.  Then the next day OTIR of the lateral malleolus, placement of syndesmosis  screw and repair of the deltoid ligament medially.  Discharge Exam: Blood pressure 157/79, pulse 103, temperature 100.8 F (38.2 C), temperature source Oral, resp. rate 20, height 5\' 11"  (1.803 m), weight 108.863 kg (240 lb), SpO2 96.00%. Bruce [name] has a posterior splint of the right lower leg.  NV is intact.  Bruce [name] is using his crutches well.  Disposition: Home.  Discharge Orders    Future Orders Please Complete By Expires   Diet - low sodium heart healthy      Call MD / Call 911      Comments:   If you experience chest pain or shortness of breath, CALL 911 and be transported to the hospital emergency room.  If you develope a fever above 101 F, pus (white drainage) or increased drainage or redness at the wound, or calf pain, call your surgeon's office.   Constipation Prevention      Comments:   Drink plenty of fluids.  Prune juice may be helpful.  You may use a stool softener, such as Colace (over the counter) 100 mg twice a day.  Use MiraLax (over the counter) for constipation as needed.   Increase activity slowly as tolerated      Weight Bearing as taught in Physical Therapy      Comments:   Use a walker or crutches as instructed.   Discharge instructions      Comments:   Elevate right foot often, use ice on it. Use crutches but do not put full weight on cast. Make appointment to office for two weeks.  Your  sutures will be removed then and a new cast applied. Call office at [phone] if any problem or hospital [phone] after hours if any problems or come to the emergency room.   Driving restrictions      Comments:   No driving for 8 weeks     Current Discharge Medication List    START taking these medications   Details  doxycycline (VIBRA-TABS) 100 MG tablet Take 1 tablet (100 mg total) by mouth every 12 (twelve) hours. Qty: 20 tablet, Refills: 0    oxyCODONE-acetaminophen (PERCOCET) 5-325 MG per tablet Take 1 tablet by mouth every 4 (four) hours as needed. Qty: 80 tablet, Refills: 0        Follow-up Information    Follow up with Bruce [name] in 2 weeks.   Contact information:   [address] [place] [address] [phone]          Signed: Darreld Mclean 03/04/2011, 7:45 AM

## 2011-03-04 NOTE — Progress Notes (Signed)
Physical Therapy Treatment Patient Details Name: Bruce [name] MRN: [id number] DOB: [birth year] Today's Date: 03/04/2011  TIME: 905-924/ GT  PT Assessment/Plan  PT - Assessment/Plan Comments on Treatment Session: Pt being d/c to home today;needed stair training, however stairs attempted x2 and pt not able/willing to negociate stairs at this time and fatigues quickly. Patient is casted in slight plantar flexion and unable ot flex knee and pt is very rprotective of ankle due to severe pain and did not think he would be able to clear RLE over stair which would cause unbearable pain; Pt has decided to stay at his cousins house where there are no stairs. PT Goals  Acute Rehab PT Goals PT Goal: Supine/Side to Sit - Progress: Met PT Goal: Sit to Supine/Side - Progress: Met PT Goal: Ambulate - Progress: Met PT Goal: Up/Down Stairs - Progress: Not met  PT Treatment Precautions/Restrictions  Precautions Precautions: Fall Required Braces or Orthoses: No Restrictions Weight Bearing Restrictions: Yes RLE Weight Bearing: Touchdown weight bearing Mobility (including Balance) Bed Mobility Supine to Sit: 6: Modified independent (Device/Increase time) Sitting - Scoot to Edge of Bed: 6: Modified independent (Device/Increase time) Transfers Transfers: Yes Sit to Stand: 5: Supervision Sit to Stand Details (indicate cue type and reason): crutches;VCs for technique of standing with crutches Stand to Sit: 4: Min assist Stand to Sit Details: crutches;VCs to reach back to control descent Ambulation/Gait Ambulation/Gait: Yes Ambulation/Gait Assistance: 5: Supervision Ambulation/Gait Assistance Details (indicate cue type and reason): crutches Ambulation Distance (Feet): 30 Feet (15' rest break 15' additional) Assistive device: Crutches Gait Pattern: Within Functional Limits Gait velocity: slow Stairs: No (stairs attempt x2;LOB x1, not completed)    Exercise    End of Session PT - End of  Session Equipment Utilized During Treatment: Gait belt (crutches) Activity Tolerance: Patient limited by fatigue;Patient limited by pain Patient left: in bed;with call bell in reach;with bed alarm set General Behavior During Session: [facility] for tasks performed Cognition: [facility] for tasks performed  Bruce [name] 03/04/2011, 9:44 AM

## 2011-03-04 NOTE — Progress Notes (Signed)
Patient d/c home with family  Left floor via wheelchair, accompanied by staff No c/o pain at d/c  Verbalized understanding of d/c instructions, rx's, and follow up appts

## 2011-03-05 ENCOUNTER — Encounter (HOSPITAL_COMMUNITY): Payer: Self-pay | Admitting: Orthopaedic Surgery

## 2011-03-08 ENCOUNTER — Emergency Department (HOSPITAL_COMMUNITY)
Admission: EM | Admit: 2011-03-08 | Discharge: 2011-03-08 | Disposition: A | Discharge status: Home / Self Care | Payer: Self-pay | Attending: Emergency Medicine | Admitting: Emergency Medicine

## 2011-03-08 ENCOUNTER — Encounter: Payer: Self-pay | Admitting: *Deleted

## 2011-03-08 ENCOUNTER — Emergency Department (HOSPITAL_COMMUNITY): Payer: Self-pay

## 2011-03-08 DIAGNOSIS — R42 Dizziness and giddiness: Secondary | ICD-10-CM | POA: Insufficient documentation

## 2011-03-08 DIAGNOSIS — R110 Nausea: Secondary | ICD-10-CM | POA: Insufficient documentation

## 2011-03-08 DIAGNOSIS — R0602 Shortness of breath: Secondary | ICD-10-CM | POA: Insufficient documentation

## 2011-03-08 MED ORDER — ONDANSETRON HCL 8 MG PO TABS: ORAL_TABLET | ORAL | Status: AC

## 2011-03-08 MED ORDER — ONDANSETRON 8 MG PO TBDP
8.0000 mg | ORAL_TABLET | Freq: Once | ORAL | Status: AC
  Administered 2011-03-08: 8 mg via ORAL
  Filled 2011-03-08: qty 1

## 2011-03-08 NOTE — ED Provider Notes (Signed)
History     CSN: [id number] Arrival date & time: 03/08/2011  2:12 AM   First MD Initiated Contact with Patient 03/08/11 [phone]      Chief Complaint  Patient presents with  . Shortness of Breath  . Nausea  . Dizziness    (Consider location/radiation/quality/duration/timing/severity/associated sxs/prior treatment) The history is provided by the patient.  pt indicates this evening he took a dose of his pain medication, percocet, and then became nauseated, bent over, having dry heaves. Then felt dizzy, warm, flushed. No emesis, nausea now mildly improved. States became sob w episode, although no preceding or current sob. No palpitations or irreg hr. No chest pain. Pt states a few days ago underwent surgical repair of right ankle fracture. No complications. Denies calf pain or swelling. No dvt or pe hx. States did take pain med on empty stomach tonight. Denies abd pain, no diarrhea. No fever or chills.  Denies cough. Symptoms improved since onset.   History reviewed. No pertinent past medical history.  Past Surgical History  Procedure Date  . Ankle fracture surgery     Family History  Problem Relation Age of Onset  . Diabetes Mother     History  Substance Use Topics  . Smoking status: Never Smoker   . Smokeless tobacco: Not on file  . Alcohol Use: No      Review of Systems  Constitutional: Negative for fever and chills.  HENT: Negative for neck pain.   Eyes: Negative for visual disturbance.  Respiratory: Negative for chest tightness and shortness of breath.   Cardiovascular: Negative for chest pain and palpitations.  Gastrointestinal: Negative for abdominal pain.  Musculoskeletal: Negative for back pain.  Skin: Negative for rash.  Neurological: Negative for headaches.  Hematological: Does not bruise/bleed easily.  Psychiatric/Behavioral: Negative for confusion.    Allergies  Review of patient's allergies indicates no known allergies.  Home Medications   Current  Outpatient Rx  Name Route Sig Dispense Refill  . DOXYCYCLINE HYCLATE 100 MG PO TBEC Oral Take 100 mg by mouth 2 (two) times daily.      . OXYCODONE-ACETAMINOPHEN 5-325 MG PO TABS Oral Take 1 tablet by mouth every 4 (four) hours as needed.        BP 135/94  Pulse 94  Temp(Src) 98.8 F (37.1 C) (Oral)  Resp 20  Ht 5\' 11"  (1.803 m)  Wt 245 lb (111.131 kg)  BMI 34.17 kg/m2  SpO2 98%  Physical Exam  Nursing note and vitals reviewed. Constitutional: He is oriented to person, place, and time. He appears well-developed and well-nourished. No distress.  HENT:  Head: Atraumatic.  Eyes: Pupils are equal, round, and reactive to light.  Neck: Neck supple. No tracheal deviation present.  Cardiovascular: Normal rate, regular rhythm, normal heart sounds and intact distal pulses.  Exam reveals no gallop and no friction rub.   No murmur heard. Pulmonary/Chest: Effort normal. No accessory muscle usage. No respiratory distress. He has no wheezes. He has no rales.  Abdominal: Soft. He exhibits no distension. There is no tenderness.  Musculoskeletal: He exhibits no edema.       Splint right lower leg. Normal cap refill distally. No calf swelling, pain or tenderness to bil ext.   Neurological: He is alert and oriented to person, place, and time.  Skin: Skin is warm and dry.  Psychiatric: He has a normal mood and affect.    ED Course  Procedures (including critical care time)  Labs Reviewed - No data to display  No results found.   No diagnosis found.    MDM  zofran po. Cxr.   No results found for this or any previous visit. Dg Chest 2 View  03/08/2011  *RADIOLOGY REPORT*  Clinical Data: Shortness of breath, chest pain.  CHEST - 2 VIEW  Comparison: None.  Findings: Heart size upper normal limits to mildly enlarged.  No focal consolidation, pleural effusion, or pneumothorax.  No acute osseous abnormality.  IMPRESSION: Heart size upper normal limits to mildly enlarged.  No focal consolidation.   Original Report Authenticated By: Waneta Martins, M.D.      Recheck pt, symptoms completely resolved. No nausea. No pain. No sob. No faintness. Hr 68, rr 16, pulse ox 100%. cxr neg.     Suzi Roots, MD 03/08/11 [phone]

## 2011-03-08 NOTE — ED Notes (Signed)
Pt stated he was feeling much better, up to wheelchair, assisted to car without difficulty

## 2011-03-08 NOTE — ED Notes (Signed)
Pt broke right ankle on Monday(last week). Pt discharged from [facility] on Thursday with a script for Percocet. Pt states he took 3 Percocets on Sunday and now c/o sob, dizziness, and nausea.

## 2011-03-08 NOTE — ED Notes (Signed)
Pt brought to er by ems, for sob, has right ankle surgery last week, started having nausea tonight and then having left side chest wall pain, stated he is feeling better at arrival to er. sats on room air 99%, resp even and unlabored. Lugs clear to ausculation

## 2011-03-22 ENCOUNTER — Other Ambulatory Visit (HOSPITAL_COMMUNITY): Payer: Self-pay | Admitting: Orthopaedic Surgery

## 2011-03-22 ENCOUNTER — Ambulatory Visit (HOSPITAL_COMMUNITY)
Admission: RE | Admit: 2011-03-22 | Discharge: 2011-03-22 | Disposition: A | Discharge status: Home / Self Care | Payer: Self-pay | Source: Ambulatory Visit | Attending: Orthopaedic Surgery | Admitting: Orthopaedic Surgery

## 2011-03-22 DIAGNOSIS — T148XXA Other injury of unspecified body region, initial encounter: Secondary | ICD-10-CM

## 2011-03-22 DIAGNOSIS — IMO0001 Reserved for inherently not codable concepts without codable children: Secondary | ICD-10-CM | POA: Insufficient documentation

## 2011-04-20 ENCOUNTER — Ambulatory Visit (HOSPITAL_COMMUNITY)
Admission: RE | Admit: 2011-04-20 | Discharge: 2011-04-20 | Disposition: A | Discharge status: Home / Self Care | Payer: Self-pay | Source: Ambulatory Visit | Attending: Orthopaedic Surgery | Admitting: Orthopaedic Surgery

## 2011-04-20 ENCOUNTER — Other Ambulatory Visit (HOSPITAL_COMMUNITY): Payer: Self-pay | Admitting: Orthopaedic Surgery

## 2011-04-20 DIAGNOSIS — T148XXA Other injury of unspecified body region, initial encounter: Secondary | ICD-10-CM

## 2011-04-20 DIAGNOSIS — IMO0001 Reserved for inherently not codable concepts without codable children: Secondary | ICD-10-CM | POA: Insufficient documentation

## 2011-04-23 ENCOUNTER — Encounter (HOSPITAL_COMMUNITY)
Admission: RE | Admit: 2011-04-23 | Discharge: 2011-04-23 | Disposition: A | Discharge status: Home / Self Care | Payer: Self-pay | Source: Ambulatory Visit | Attending: Orthopaedic Surgery | Admitting: Orthopaedic Surgery

## 2011-04-23 ENCOUNTER — Encounter (HOSPITAL_COMMUNITY): Payer: Self-pay

## 2011-04-23 LAB — COMPREHENSIVE METABOLIC PANEL
ALT: 69 U/L — ABNORMAL HIGH (ref 0–53)
AST: 69 U/L — ABNORMAL HIGH (ref 0–37)
Albumin: 4.3 g/dL (ref 3.5–5.2)
Alkaline Phosphatase: 55 U/L (ref 39–117)
BUN: 12 mg/dL (ref 6–23)
CO2: 32 mEq/L (ref 19–32)
Calcium: 10.8 mg/dL — ABNORMAL HIGH (ref 8.4–10.5)
Chloride: 100 mEq/L (ref 96–112)
Creatinine, Ser: 1.28 mg/dL (ref 0.50–1.35)
GFR calc Af Amer: 88 mL/min — ABNORMAL LOW (ref >90)
GFR calc non Af Amer: 76 mL/min — ABNORMAL LOW (ref >90)
Glucose, Bld: 107 mg/dL — ABNORMAL HIGH (ref 70–99)
Potassium: 4.3 mEq/L (ref 3.5–5.1)
Sodium: 139 mEq/L (ref 135–145)
Total Bilirubin: 1.2 mg/dL (ref 0.3–1.2)
Total Protein: 8 g/dL (ref 6.0–8.3)

## 2011-04-23 LAB — SURGICAL PCR SCREEN
MRSA, PCR: NEGATIVE
Staphylococcus aureus: NEGATIVE

## 2011-04-23 NOTE — Patient Instructions (Addendum)
20 COUGAR IMEL  04/23/2011   Your procedure is scheduled on:  04/29/2011  Report to [facility] at  815  AM.  Call this number if you have problems the morning of surgery: [phone]   Remember:   Do not eat food:After Midnight.  May have clear liquids:until Midnight .  Clear liquids include soda, tea, black coffee, apple or grape juice, broth.  Take these medicines the morning of surgery with A SIP OF WATER: none   Do not wear jewelry, make-up or nail polish.  Do not wear lotions, powders, or perfumes. You may wear deodorant.  Do not shave 48 hours prior to surgery.  Do not bring valuables to the hospital.  Contacts, dentures or bridgework may not be worn into surgery.  Leave suitcase in the car. After surgery it may be brought to your room.  For patients admitted to the hospital, checkout time is 11:00 AM the day of discharge.   Patients discharged the day of surgery will not be allowed to drive home.  Name and phone number of your driver: family  Special Instructions: CHG Shower Use Special Wash: 1/2 bottle night before surgery and 1/2 bottle morning of surgery.   Please read over the following fact sheets that you were given: Pain Booklet, MRSA Information, Surgical Site Infection Prevention, Anesthesia Post-op Instructions and Care and Recovery After Surgery PATIENT INSTRUCTIONS POST-ANESTHESIA  IMMEDIATELY FOLLOWING SURGERY:  Do not drive or operate machinery for the first twenty four hours after surgery.  Do not make any important decisions for twenty four hours after surgery or while taking narcotic pain medications or sedatives.  If you develop intractable nausea and vomiting or a severe headache please notify your doctor immediately.  FOLLOW-UP:  Please make an appointment with your surgeon as instructed. You do not need to follow up with anesthesia unless specifically instructed to do so.  WOUND CARE INSTRUCTIONS (if applicable):  Keep a dry clean dressing on the  anesthesia/puncture wound site if there is drainage.  Once the wound has quit draining you may leave it open to air.  Generally you should leave the bandage intact for twenty four hours unless there is drainage.  If the epidural site drains for more than 36-48 hours please call the anesthesia department.  QUESTIONS?:  Please feel free to call your physician or the hospital operator if you have any questions, and they will be happy to assist you.     [facility] Anesthesia Department [address] [place] [phone]

## 2011-04-28 NOTE — H&P (Addendum)
Bruce [name] is an 25 y.o. male.   Chief Complaint: I am to have my ankle screw removed. HPI: He had a fracture of the left ankle on 03/01/11 with disruption of the syndesmosis and medial and lateral malleoli fractures.  A syndesmosis screw was inserted on 03/02/11 along with repair of the fractures.  He is now to have staged, elective removal of the syndesmosis screw from the lateral side of the ankle.  He was aware of the need to remove the screw from the prior surgery.  He has been nonweight bearing.  He understands the risks and imponderables of the procedure.  No past medical history on file.  Past Surgical History  Procedure Date  . Wisdom tooth extraction   . Orif ankle fracture 03/02/2011    Procedure: OPEN REDUCTION INTERNAL FIXATION (ORIF) ANKLE FRACTURE;  Surgeon: Darreld Mclean;  Location: [facility];  Service: Orthopedics;  Laterality: Right;  . Ankle fracture surgery     Family History  Problem Relation Age of Onset  . Anesthesia problems Neg Hx   . Hypotension Neg Hx   . Malignant hyperthermia Neg Hx   . Pseudochol deficiency Neg Hx   . Diabetes Mother    Social History:  reports that he has never smoked. He does not have any smokeless tobacco history on file. He reports that he does not drink alcohol or use illicit drugs.  Allergies: No Known Allergies  No current facility-administered medications on file as of .   No current outpatient prescriptions on file as of .    No results found for this or any previous visit (from the past 48 hour(s)). No results found.  Review of Systems  Constitutional: Negative.   HENT: Negative.   Eyes: Negative.   Respiratory: Negative.   Cardiovascular: Negative.   Gastrointestinal: Negative.   Genitourinary: Negative.   Musculoskeletal: Joint pain: Left ankle pain post surgery on 03/02/11.  Skin: Negative.   Neurological: Negative.   Psychiatric/Behavioral: Negative.     There were no vitals taken for this visit. Physical  Exam  Constitutional: He is oriented to person, place, and time. He appears well-developed and well-nourished.  HENT:  Head: Normocephalic and atraumatic.  Eyes: Conjunctivae and EOM are normal. Pupils are equal, round, and reactive to light.  Neck: Normal range of motion. Neck supple.  Cardiovascular: Normal rate, regular rhythm, normal heart sounds and intact distal pulses.   Respiratory: Effort normal and breath sounds normal.  GI: Soft. Bowel sounds are normal.  Musculoskeletal: He exhibits tenderness (Left ankle).       Left ankle: tenderness.       Feet:  Neurological: He is alert and oriented to person, place, and time. He has normal reflexes.  Skin: Skin is warm and dry.  Psychiatric: He has a normal mood and affect. His behavior is normal. Judgment and thought content normal.     Assessment/Plan Post bimalleolar fracture of the left ankle and disruption of the syndesmosis.  Now for elective removal of the syndesmosis screw.  Dardan Shelton 04/28/2011, 9:11 PM

## 2011-04-29 ENCOUNTER — Encounter (HOSPITAL_COMMUNITY): Payer: Self-pay | Admitting: *Deleted

## 2011-04-29 ENCOUNTER — Encounter (HOSPITAL_COMMUNITY)
Admission: RE | Disposition: A | Discharge status: Home / Self Care | Payer: Self-pay | Source: Ambulatory Visit | Attending: Orthopaedic Surgery

## 2011-04-29 ENCOUNTER — Encounter (HOSPITAL_COMMUNITY): Payer: Self-pay | Admitting: Anesthesiology

## 2011-04-29 ENCOUNTER — Ambulatory Visit (HOSPITAL_COMMUNITY)
Admission: RE | Admit: 2011-04-29 | Discharge: 2011-04-29 | Disposition: A | Discharge status: Home / Self Care | Payer: Self-pay | Source: Ambulatory Visit | Attending: Orthopaedic Surgery | Admitting: Orthopaedic Surgery

## 2011-04-29 ENCOUNTER — Ambulatory Visit (HOSPITAL_COMMUNITY): Payer: Self-pay | Admitting: Anesthesiology

## 2011-04-29 ENCOUNTER — Ambulatory Visit (HOSPITAL_COMMUNITY): Payer: Self-pay

## 2011-04-29 DIAGNOSIS — Z472 Encounter for removal of internal fixation device: Secondary | ICD-10-CM | POA: Insufficient documentation

## 2011-04-29 DIAGNOSIS — Z01812 Encounter for preprocedural laboratory examination: Secondary | ICD-10-CM | POA: Insufficient documentation

## 2011-04-29 DIAGNOSIS — E119 Type 2 diabetes mellitus without complications: Secondary | ICD-10-CM | POA: Insufficient documentation

## 2011-04-29 HISTORY — PX: HARDWARE REMOVAL: SHX979

## 2011-04-29 SURGERY — REMOVAL, HARDWARE
Anesthesia: General | Site: Ankle | Laterality: Right | Wound class: Clean

## 2011-04-29 MED ORDER — SODIUM CHLORIDE 0.9 % IR SOLN
Status: DC | PRN
  Administered 2011-04-29: 1000 mL

## 2011-04-29 MED ORDER — LACTATED RINGERS IV SOLN
INTRAVENOUS | Status: DC | PRN
  Administered 2011-04-29: 09:00:00 via INTRAVENOUS

## 2011-04-29 MED ORDER — CEFAZOLIN SODIUM 1-5 GM-% IV SOLN: 1.0000 g | INTRAVENOUS | Status: DC

## 2011-04-29 MED ORDER — MIDAZOLAM HCL 2 MG/2ML IJ SOLN
INTRAMUSCULAR | Status: AC
  Filled 2011-04-29: qty 2

## 2011-04-29 MED ORDER — FENTANYL CITRATE 0.05 MG/ML IJ SOLN
INTRAMUSCULAR | Status: AC
  Administered 2011-04-29: 25 ug via INTRAVENOUS
  Filled 2011-04-29: qty 2

## 2011-04-29 MED ORDER — HYDROCODONE-ACETAMINOPHEN 7.5-325 MG PO TABS: 1.0000 | ORAL_TABLET | ORAL | Status: DC | PRN

## 2011-04-29 MED ORDER — MIDAZOLAM HCL 2 MG/2ML IJ SOLN
INTRAMUSCULAR | Status: AC
  Administered 2011-04-29: 2 mg via INTRAVENOUS
  Filled 2011-04-29: qty 2

## 2011-04-29 MED ORDER — ONDANSETRON HCL 4 MG/2ML IJ SOLN: 4.0000 mg | Freq: Once | INTRAMUSCULAR | Status: DC | PRN

## 2011-04-29 MED ORDER — FENTANYL CITRATE 0.05 MG/ML IJ SOLN
INTRAMUSCULAR | Status: AC
  Administered 2011-04-29: 50 ug via INTRAVENOUS
  Filled 2011-04-29: qty 2

## 2011-04-29 MED ORDER — LACTATED RINGERS IV SOLN
INTRAVENOUS | Status: DC
Start: 2011-04-29 — End: 2011-04-29
  Administered 2011-04-29: 09:00:00 via INTRAVENOUS

## 2011-04-29 MED ORDER — CEFAZOLIN SODIUM 1-5 GM-% IV SOLN
INTRAVENOUS | Status: AC
  Filled 2011-04-29: qty 50

## 2011-04-29 MED ORDER — PROPOFOL 10 MG/ML IV EMUL
INTRAVENOUS | Status: AC
  Filled 2011-04-29: qty 20

## 2011-04-29 MED ORDER — FENTANYL CITRATE 0.05 MG/ML IJ SOLN
25.0000 ug | INTRAMUSCULAR | Status: DC | PRN
  Administered 2011-04-29 (×2): 25 ug via INTRAVENOUS
  Administered 2011-04-29: 50 ug via INTRAVENOUS

## 2011-04-29 MED ORDER — FENTANYL CITRATE 0.05 MG/ML IJ SOLN
INTRAMUSCULAR | Status: DC | PRN
  Administered 2011-04-29: 50 ug via INTRAVENOUS
  Administered 2011-04-29: 25 ug via INTRAVENOUS
  Administered 2011-04-29 (×2): 50 ug via INTRAVENOUS
  Administered 2011-04-29: 25 ug via INTRAVENOUS

## 2011-04-29 MED ORDER — CEFAZOLIN SODIUM 1-5 GM-% IV SOLN
INTRAVENOUS | Status: DC | PRN
  Administered 2011-04-29: 1 g via INTRAVENOUS

## 2011-04-29 MED ORDER — LIDOCAINE HCL (PF) 1 % IJ SOLN
INTRAMUSCULAR | Status: AC
  Filled 2011-04-29: qty 5

## 2011-04-29 MED ORDER — PROPOFOL 10 MG/ML IV EMUL
INTRAVENOUS | Status: DC | PRN
  Administered 2011-04-29: 250 mg via INTRAVENOUS

## 2011-04-29 MED ORDER — MIDAZOLAM HCL 2 MG/2ML IJ SOLN
1.0000 mg | INTRAMUSCULAR | Status: DC | PRN
  Administered 2011-04-29 (×2): 2 mg via INTRAVENOUS

## 2011-04-29 MED ORDER — LIDOCAINE HCL (CARDIAC) 10 MG/ML IV SOLN
INTRAVENOUS | Status: DC | PRN
  Administered 2011-04-29: 50 mg via INTRAVENOUS

## 2011-04-29 SURGICAL SUPPLY — 34 items
BAG HAMPER (MISCELLANEOUS) ×2 IMPLANT
BANDAGE ELASTIC 4 VELCRO NS (GAUZE/BANDAGES/DRESSINGS) ×2 IMPLANT
BANDAGE ESMARK 4X12 BL STRL LF (DISPOSABLE) ×1 IMPLANT
BLADE SURG SZ10 CARB STEEL (BLADE) ×2 IMPLANT
BNDG COHESIVE 4X5 TAN NS LF (GAUZE/BANDAGES/DRESSINGS) ×2 IMPLANT
BNDG ESMARK 4X12 BLUE STRL LF (DISPOSABLE) ×2
CLOTH BEACON ORANGE TIMEOUT ST (SAFETY) ×2 IMPLANT
COVER LIGHT HANDLE STERIS (MISCELLANEOUS) ×4 IMPLANT
CUFF TOURNIQUET SINGLE 44IN (TOURNIQUET CUFF) ×2 IMPLANT
DRAPE C-ARM FOLDED MOBILE STRL (DRAPES) ×2 IMPLANT
GAUZE XEROFORM 5X9 LF (GAUZE/BANDAGES/DRESSINGS) ×2 IMPLANT
GLOVE BIO SURGEON STRL SZ8 (GLOVE) ×2 IMPLANT
GLOVE BIO SURGEON STRL SZ8.5 (GLOVE) ×2 IMPLANT
GLOVE BIOGEL PI IND STRL 7.0 (GLOVE) ×2 IMPLANT
GLOVE BIOGEL PI INDICATOR 7.0 (GLOVE) ×2
GLOVE ECLIPSE 6.5 STRL STRAW (GLOVE) ×2 IMPLANT
GLOVE EXAM NITRILE MD LF STRL (GLOVE) ×2 IMPLANT
GOWN STRL REIN XL XLG (GOWN DISPOSABLE) ×6 IMPLANT
INST SET MINOR BONE (KITS) ×2 IMPLANT
KIT ROOM TURNOVER APOR (KITS) ×2 IMPLANT
MANIFOLD NEPTUNE II (INSTRUMENTS) ×2 IMPLANT
NS IRRIG 1000ML POUR BTL (IV SOLUTION) ×2 IMPLANT
PACK BASIC LIMB (CUSTOM PROCEDURE TRAY) ×2 IMPLANT
PAD CAST 4YDX4 CTTN HI CHSV (CAST SUPPLIES) ×1 IMPLANT
PADDING CAST COTTON 4X4 STRL (CAST SUPPLIES) ×1
SET BASIN LINEN APH (SET/KITS/TRAYS/PACK) ×2 IMPLANT
SOL PREP PROV IODINE SCRUB 4OZ (MISCELLANEOUS) ×2 IMPLANT
SPONGE GAUZE 4X4 12PLY (GAUZE/BANDAGES/DRESSINGS) ×2 IMPLANT
SPONGE LAP 18X18 X RAY DECT (DISPOSABLE) ×2 IMPLANT
STAPLER VISISTAT 35W (STAPLE) ×2 IMPLANT
SUT CHROMIC 0 CT 1 (SUTURE) ×2 IMPLANT
SUT CHROMIC VP 2 (SUTURE) IMPLANT
SUT ETHILON 3 0 FSL (SUTURE) IMPLANT
SUT PLAIN 2 0 XLH (SUTURE) ×2 IMPLANT

## 2011-04-29 NOTE — Anesthesia Procedure Notes (Signed)
Procedure Name: LMA Insertion Date/Time: 04/29/2011 10:01 AM Performed by: Carolyne Littles, AMY Pre-anesthesia Checklist: Patient identified, Patient being monitored, Emergency Drugs available, Timeout performed and Suction available Patient Re-evaluated:Patient Re-evaluated prior to inductionOxygen Delivery Method: Circle System Utilized Preoxygenation: Pre-oxygenation with 100% oxygen Intubation Type: IV induction Ventilation: Mask ventilation without difficulty LMA Size: 4.0 Number of attempts: 1 Placement Confirmation: positive ETCO2 and breath sounds checked- equal and bilateral Tube secured with: Tape Dental Injury: Teeth and Oropharynx as per pre-operative assessment

## 2011-04-29 NOTE — Brief Op Note (Signed)
04/29/2011  10:26 AM  PATIENT:  Bruce [name]  25 y.o. male  PRE-OPERATIVE DIAGNOSIS:  post ankle fracture right with disruption of syndesmosis, for staged removal of syndesmosis screw   POST-OPERATIVE DIAGNOSIS: same PROCEDURE:  Procedure(s): HARDWARE REMOVAL of syndesmosis screw of right ankle  SURGEON:  Surgeon(s): [employer]  PHYSICIAN ASSISTANT:   ASSISTANTS: none   ANESTHESIA:   general  EBL:  Total I/O In: 500 [I.V.:500] Out: 5 [Blood:5]  BLOOD ADMINISTERED:none  DRAINS: none   LOCAL MEDICATIONS USED:  NONE  SPECIMEN:  No Specimen  DISPOSITION OF SPECIMEN:  N/A  COUNTS:  YES  TOURNIQUET:  * Missing tourniquet times found for documented tourniquets in log:  [id number] *  DICTATION: .Other Dictation: Dictation Number [id number]  PLAN OF CARE: Discharge to home after PACU  PATIENT DISPOSITION:  PACU - hemodynamically stable.   Delay start of Pharmacological VTE agent (>24hrs) due to surgical blood loss or risk of bleeding: n/a

## 2011-04-29 NOTE — Progress Notes (Signed)
The History and Physical is unchanged. I have examined the patient. The patient is medically able to have surgery on the right ankle . [name] 

## 2011-04-29 NOTE — Anesthesia Preprocedure Evaluation (Addendum)
Anesthesia Evaluation  Patient identified by MRN, date of birth, ID band Patient awake    Reviewed: Allergy & Precautions, H&P , NPO status , Patient's Chart, lab work & pertinent test results  Airway Mallampati: I      Dental  (+) Teeth Intact   Pulmonary neg pulmonary ROS,  clear to auscultation        Cardiovascular neg cardio ROS Regular Normal    Neuro/Psych    GI/Hepatic   Endo/Other    Renal/GU      Musculoskeletal   Abdominal   Peds  Hematology   Anesthesia Other Findings   Reproductive/Obstetrics                           Anesthesia Physical Anesthesia Plan  ASA: I  Anesthesia Plan: General   Post-op Pain Management:    Induction: Intravenous  Airway Management Planned: LMA  Additional Equipment:   Intra-op Plan:   Post-operative Plan: Extubation in OR  Informed Consent: I have reviewed the patients History and Physical, chart, labs and discussed the procedure including the risks, benefits and alternatives for the proposed anesthesia with the patient or authorized representative who has indicated his/her understanding and acceptance.     Plan Discussed with:   Anesthesia Plan Comments:         Anesthesia Quick Evaluation

## 2011-04-29 NOTE — Transfer of Care (Signed)
Immediate Anesthesia Transfer of Care Note  Patient: Bruce [name]  Procedure(s) Performed:  HARDWARE REMOVAL - Removal Screw Right Ankle   Patient Location: PACU  Anesthesia Type: General  Level of Consciousness: awake, alert , oriented and patient cooperative  Airway & Oxygen Therapy: Patient Spontanous Breathing and Patient connected to face mask oxygen  Post-op Assessment: Post -op Vital signs reviewed and stable  Post vital signs: Reviewed and stable  Complications: No apparent anesthesia complications

## 2011-04-29 NOTE — Anesthesia Postprocedure Evaluation (Signed)
  Anesthesia Post-op Note  Patient: Bruce [name]  Procedure(s) Performed:  HARDWARE REMOVAL - Removal Screw Right Ankle   Patient Location: PACU  Anesthesia Type: General  Level of Consciousness: awake, alert  and oriented  Airway and Oxygen Therapy: Patient Spontanous Breathing and Patient connected to face mask oxygen  Post-op Pain: none  Post-op Assessment: Post-op Vital signs reviewed, Patient's Cardiovascular Status Stable, Respiratory Function Stable, Patent Airway and No signs of Nausea or vomiting  Post-op Vital Signs: Reviewed and stable  Complications: No apparent anesthesia complications

## 2011-04-30 NOTE — Op Note (Signed)
NAME:  ALPHONZO, DEVERA NO.:  [id number]  MEDICAL RECORD NO.:  [id number]  LOCATION:  [facility]                          FACILITY:  [facility]  PHYSICIAN:  J. Darreld Mclean, M.D. DATE OF BIRTH:  1986/03/22  DATE OF PROCEDURE: DATE OF DISCHARGE:  04/29/2011                              OPERATIVE REPORT   PREOPERATIVE DIAGNOSIS:  Status post fracture dislocation of the right ankle now for staged removal of syndesmosis screw of the right ankle.  POSTOPERATIVE DIAGNOSIS:  Status post fracture dislocation of the right ankle now for staged removal of syndesmosis screw of the right ankle.  PROCEDURE:  Removal of syndesmosis screw, right ankle.  ANESTHESIA:  General.  TOURNIQUET TIME:  Less than 5 minutes.  SURGEON:  J. Darreld Mclean, MD  INDICATIONS:  The patient is a 25 year old male who had a fracture dislocation of his right ankle.  On March 01, 2011, I did a closed reduction of dislocation in the emergency room.  He was brought to surgery the following day and had fractures reduced and a syndesmosis screw placed because he had a disruption of syndesmosis.  He is coming back today for planned removal.  Previously, he had staged removal of the syndesmosis screw of the right ankle.  The patient understands the risks and imponderables.  He understands the need for therapy if his ankle moving again.  He had been nonweightbearing as long as syndesmosis screw was in place.  DESCRIPTION OF PROCEDURE:  The patient was seen in the holding area. The right ankle was identified as correct surgical site.  I placed a mark on the right ankle laterally.  He was brought back to the operating room, placed supine on the operating room table.  General anesthesia was given.  Tourniquet placed deflated right upper thigh.  He was prepped and draped in usual manner.  A generalized time-out identifying the patient as Mr. Haste, we are doing his right ankle removal of hardware.  All  instrumentations were properly positioned, OR team knew each other and the Radiology Tech was present and the C-arm fluoroscopy unit was in place.  We had a time-out for the radiology procedure until everyone had apron worn and badges.  Tourniquet was inflated to 300 mmHg.  A small incision was made laterally over the screw site after ascertaining the correct area with the C-arm fluoroscopy unit.  The screw was then removed without difficulty.  Wounds were reapproximated with 2-0 chromic and skin staples.  Tourniquet deflated. Sterile dressing applied.  The patient tolerated the procedure well, brought to recovery in good condition.  He will undergo therapy, and I will see him in the office in approximately 10 days.  If any difficulties, contact me through the office hospital beeper system.          ______________________________ Shela Commons. Darreld Mclean, M.D.     JWK/MEDQ  D:  04/29/2011  T:  04/30/2011  Job:  [id number]

## 2011-05-05 ENCOUNTER — Encounter (HOSPITAL_COMMUNITY): Payer: Self-pay | Admitting: Orthopaedic Surgery

## 2013-08-10 IMAGING — RF DG ANKLE 2V *R*
1 series · 3 of 3 positions shown · non-contrast
Comparison: Three views of the right ankle 04/20/2011.

CLINICAL DATA: Hardware removal.  Right ankle fracture.

RIGHT ANKLE - 2 VIEW

[Series 1: run · 3 of 3 slices shown]
[im 1/3]
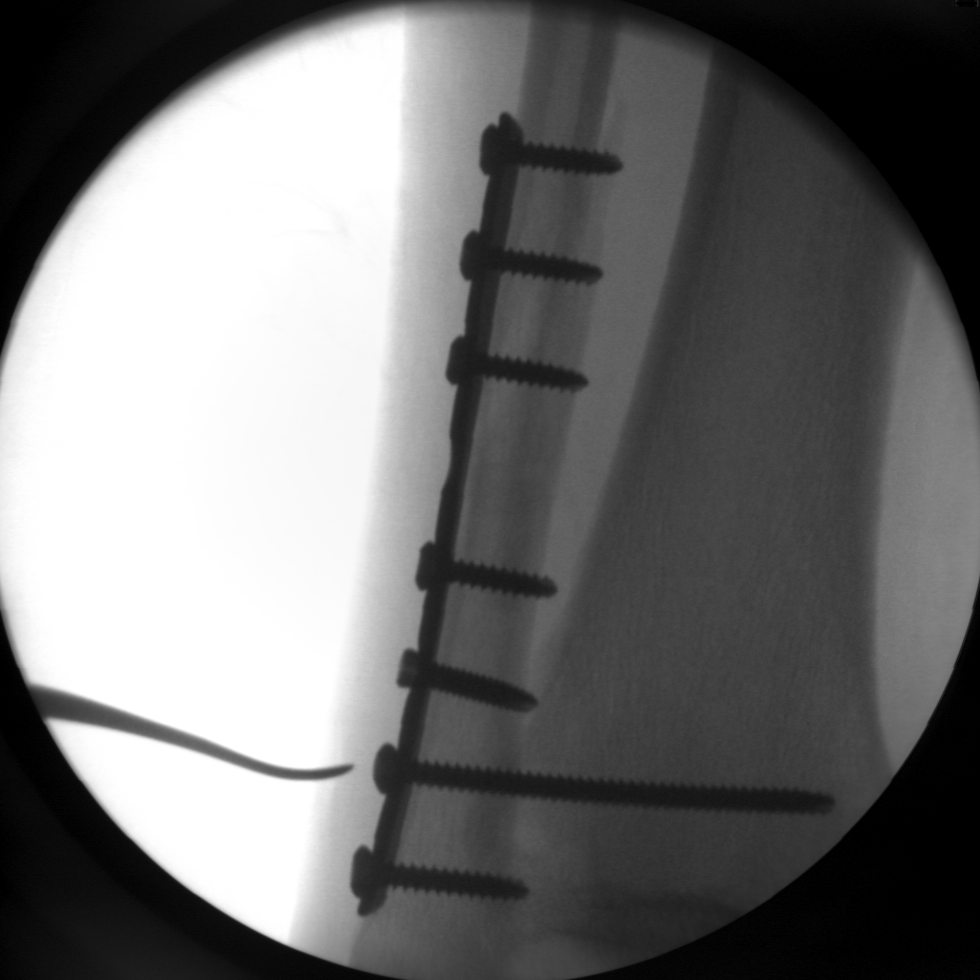
[im 2/3]
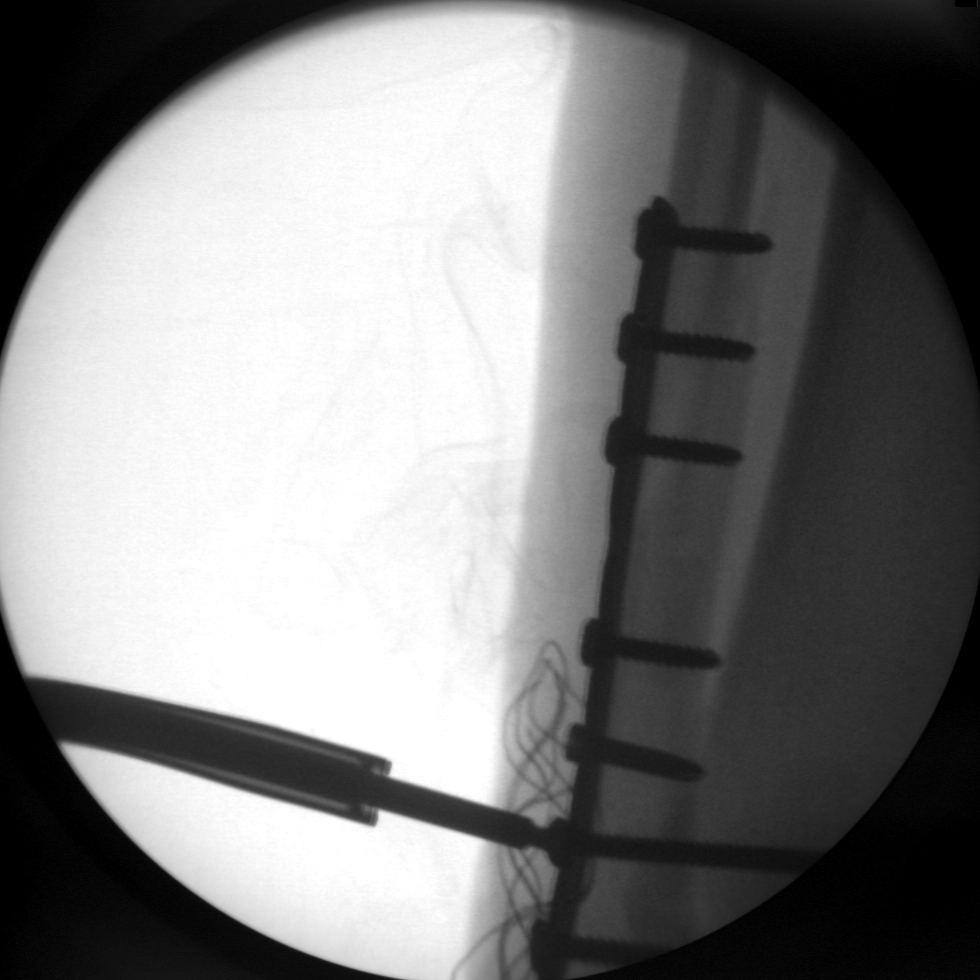
[im 3/3]
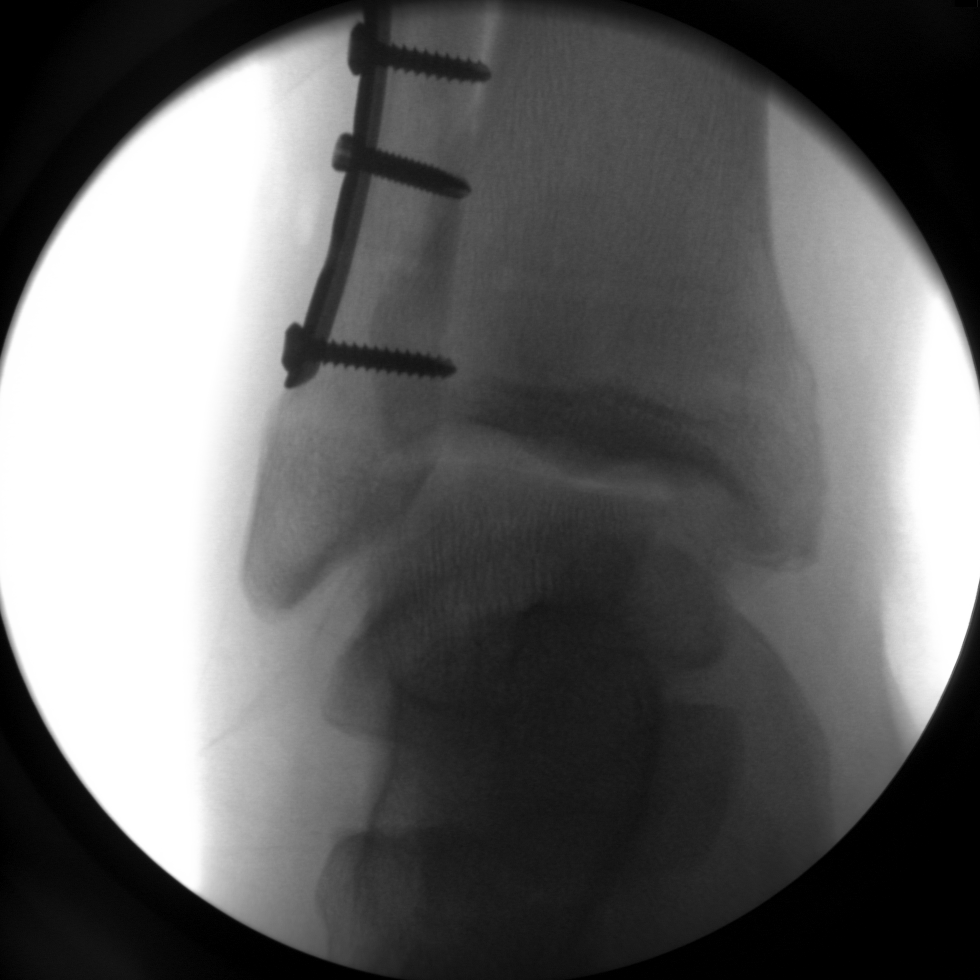

[3 of 3 positions shown; findings below may reference images not displayed]

FINDINGS: Three intraoperative fluoro spot images are submitted.
The first image demonstrates a surgical probe directed over the
head of a lag screw which crosses from the fibula to the tibia.
The second image demonstrates that screw engaged.  The third image
demonstrates the removal of that screw.  Only fibular screws are
now in place.
IMPRESSION: 1.  Interval removal of transosseous screw across the fibula and
tibia without radiographic evidence for complication.

## 2014-03-21 ENCOUNTER — Emergency Department (HOSPITAL_COMMUNITY)
Admission: EM | Admit: 2014-03-21 | Discharge: 2014-03-21 | Disposition: A | Discharge status: Home / Self Care | Payer: BC Managed Care – PPO | Attending: Emergency Medicine | Admitting: Emergency Medicine

## 2014-03-21 ENCOUNTER — Encounter (HOSPITAL_COMMUNITY): Payer: Self-pay | Admitting: *Deleted

## 2014-03-21 ENCOUNTER — Emergency Department (HOSPITAL_COMMUNITY): Payer: BC Managed Care – PPO

## 2014-03-21 DIAGNOSIS — Y9302 Activity, running: Secondary | ICD-10-CM | POA: Insufficient documentation

## 2014-03-21 DIAGNOSIS — Y998 Other external cause status: Secondary | ICD-10-CM | POA: Diagnosis not present

## 2014-03-21 DIAGNOSIS — S99912A Unspecified injury of left ankle, initial encounter: Secondary | ICD-10-CM | POA: Diagnosis present

## 2014-03-21 DIAGNOSIS — Z9889 Other specified postprocedural states: Secondary | ICD-10-CM | POA: Diagnosis not present

## 2014-03-21 DIAGNOSIS — X58XXXA Exposure to other specified factors, initial encounter: Secondary | ICD-10-CM | POA: Diagnosis not present

## 2014-03-21 DIAGNOSIS — Y9289 Other specified places as the place of occurrence of the external cause: Secondary | ICD-10-CM | POA: Insufficient documentation

## 2014-03-21 DIAGNOSIS — S86019A Strain of unspecified Achilles tendon, initial encounter: Secondary | ICD-10-CM | POA: Diagnosis not present

## 2014-03-21 DIAGNOSIS — S86012A Strain of left Achilles tendon, initial encounter: Secondary | ICD-10-CM

## 2014-03-21 DIAGNOSIS — T1490XA Injury, unspecified, initial encounter: Secondary | ICD-10-CM

## 2014-03-21 MED ORDER — OXYCODONE-ACETAMINOPHEN 5-325 MG PO TABS: 2.0000 | ORAL_TABLET | ORAL | Status: AC | PRN

## 2014-03-21 NOTE — ED Notes (Signed)
Pt reports injury to left ankle.  States he "felt a pop" while running

## 2014-03-21 NOTE — Discharge Instructions (Signed)
Dr. Hilda LiasKeeling wants to see you in the office in the morning. You can take advil in addition to the medication we give you tonight.

## 2014-03-21 NOTE — ED Provider Notes (Signed)
CSN: [id number]     Arrival date & time 03/21/14  2057 History   First MD Initiated Contact with Patient 03/21/14 2110     Chief Complaint  Patient presents with  . Ankle Pain     (Consider location/radiation/quality/duration/timing/severity/associated sxs/prior Treatment) Patient is a 28 y.o. male presenting with ankle pain. The history is provided by the patient.  Ankle Pain Location:  Ankle Injury: yes   Mechanism of injury comment:  Running Ankle location:  L ankle Pain details:    Quality:  Shooting and sharp   Radiates to:  Does not radiate   Onset quality:  Sudden   Timing:  Constant   Progression:  Worsening Chronicity:  New Dislocation: no   Foreign body present:  No foreign bodies  Irineo Axonntoine Q Marrocco is a 28 y.o. male who presents to the ED with pain to the back of his ankle. He states he was running and tuned quickly and felt a pop and and had severe pain. He has been unable to bear weight since the injury.    History reviewed. No pertinent past medical history. Past Surgical History  Procedure Laterality Date  . Wisdom tooth extraction    . Orif ankle fracture  03/02/2011    Procedure: OPEN REDUCTION INTERNAL FIXATION (ORIF) ANKLE FRACTURE;  Surgeon: Darreld McleanWayne Keeling;  Location: [facility];  Service: Orthopedics;  Laterality: Right;  . Ankle fracture surgery    . Hardware removal  04/29/2011    Procedure: HARDWARE REMOVAL;  Surgeon: Darreld McleanWayne Keeling;  Location: [facility];  Service: Orthopedics;  Laterality: Right;  Removal Screw Right Ankle; status post right ankle fracture   Family History  Problem Relation Age of Onset  . Anesthesia problems Neg Hx   . Hypotension Neg Hx   . Malignant hyperthermia Neg Hx   . Pseudochol deficiency Neg Hx   . Diabetes Mother    History  Substance Use Topics  . Smoking status: Never Smoker   . Smokeless tobacco: Not on file  . Alcohol Use: No    Review of Systems Negative except as stated in HPI   Allergies  Review of patient's  allergies indicates no known allergies.  Home Medications   Prior to Admission medications   Medication Sig Start Date End Date Taking? Authorizing Provider  HYDROcodone-acetaminophen (NORCO) 7.5-325 MG per tablet Take 1 tablet by mouth every 4 (four) hours as needed. For pain     Historical Provider, MD   BP 149/79 mmHg  Pulse 85  Temp(Src) 98.6 F (37 C) (Oral)  Resp 20  Ht 5\' 11"  (1.803 m)  Wt 245 lb (111.131 kg)  BMI 34.19 kg/m2  SpO2 100% Physical Exam  Constitutional: He is oriented to person, place, and time. He appears well-developed and well-nourished.  HENT:  Head: Normocephalic.  Eyes: EOM are normal.  Neck: Neck supple.  Cardiovascular: Normal rate.   Pulmonary/Chest: Effort normal.  Abdominal: Soft. There is no tenderness.  Musculoskeletal:       Left ankle: He exhibits decreased range of motion and swelling. He exhibits no laceration and normal pulse. Tenderness. Achilles tendon exhibits pain, defect and abnormal Thompson's test results.       Feet:  Defect palpated left achilles tendon, tender with palpation. Pain radiates to the calf.   Neurological: He is alert and oriented to person, place, and time. No cranial nerve deficit.  Skin: Skin is warm and dry.  Psychiatric: He has a normal mood and affect. His behavior is normal.  Nursing  note and vitals reviewed.   ED Course  Procedures  Dg Ankle Complete Left  03/21/2014   CLINICAL DATA:  Felt a pop in the back of ankle while playing basketball  EXAM: LEFT ANKLE COMPLETE - 3+ VIEW  COMPARISON:  None.  FINDINGS: There is no evidence of fracture, dislocation, or joint effusion. There is no evidence of arthropathy or other focal bone abnormality. Mild irregularity of the anterior aspect of the Achilles tendon with edema in Kager's fat.  IMPRESSION: Mild irregularity of the anterior aspect of the Achilles tendon with edema in Kager's fat as can be seen with an Achilles injury.   Electronically Signed   By: Elige KoHetal   Patel   On: 03/21/2014 21:33    Consult with Dr. Hilda LiasKeeling and patient placed in posterior splint, crutches pain management, crutches and follow up in the office 11/20.  MDM  28 y.o. male with pain to the posterior aspect of the left ankle s/p injury. Stable for discharge without neurovascular deficits. Discussed with the patient and all questioned fully answered.    Medication List    TAKE these medications        oxyCODONE-acetaminophen 5-325 MG per tablet  Commonly known as:  PERCOCET/ROXICET  Take 2 tablets by mouth every 4 (four) hours as needed for moderate pain or severe pain.      ASK your doctor about these medications        HYDROcodone-acetaminophen 7.5-325 MG per tablet  Commonly known as:  NORCO  Take 1 tablet by mouth every 4 (four) hours as needed. For pain          [facility] M Kristelle Cavallaro, NP 03/22/14 1936  Vanetta MuldersScott Zackowski, MD 03/24/14 [id number]

## 2014-03-26 MED FILL — Oxycodone w/ Acetaminophen Tab 5-325 MG: ORAL | Qty: 6 | Status: AC

## 2016-07-02 IMAGING — CR DG ANKLE COMPLETE 3+V*L*
3 series · 3 of 3 positions shown · non-contrast
Comparison: None.

CLINICAL DATA: Felt a pop in the back of ankle while playing
basketball

EXAM:
LEFT ANKLE COMPLETE - 3+ VIEW

[view not recorded (1 of 3)]
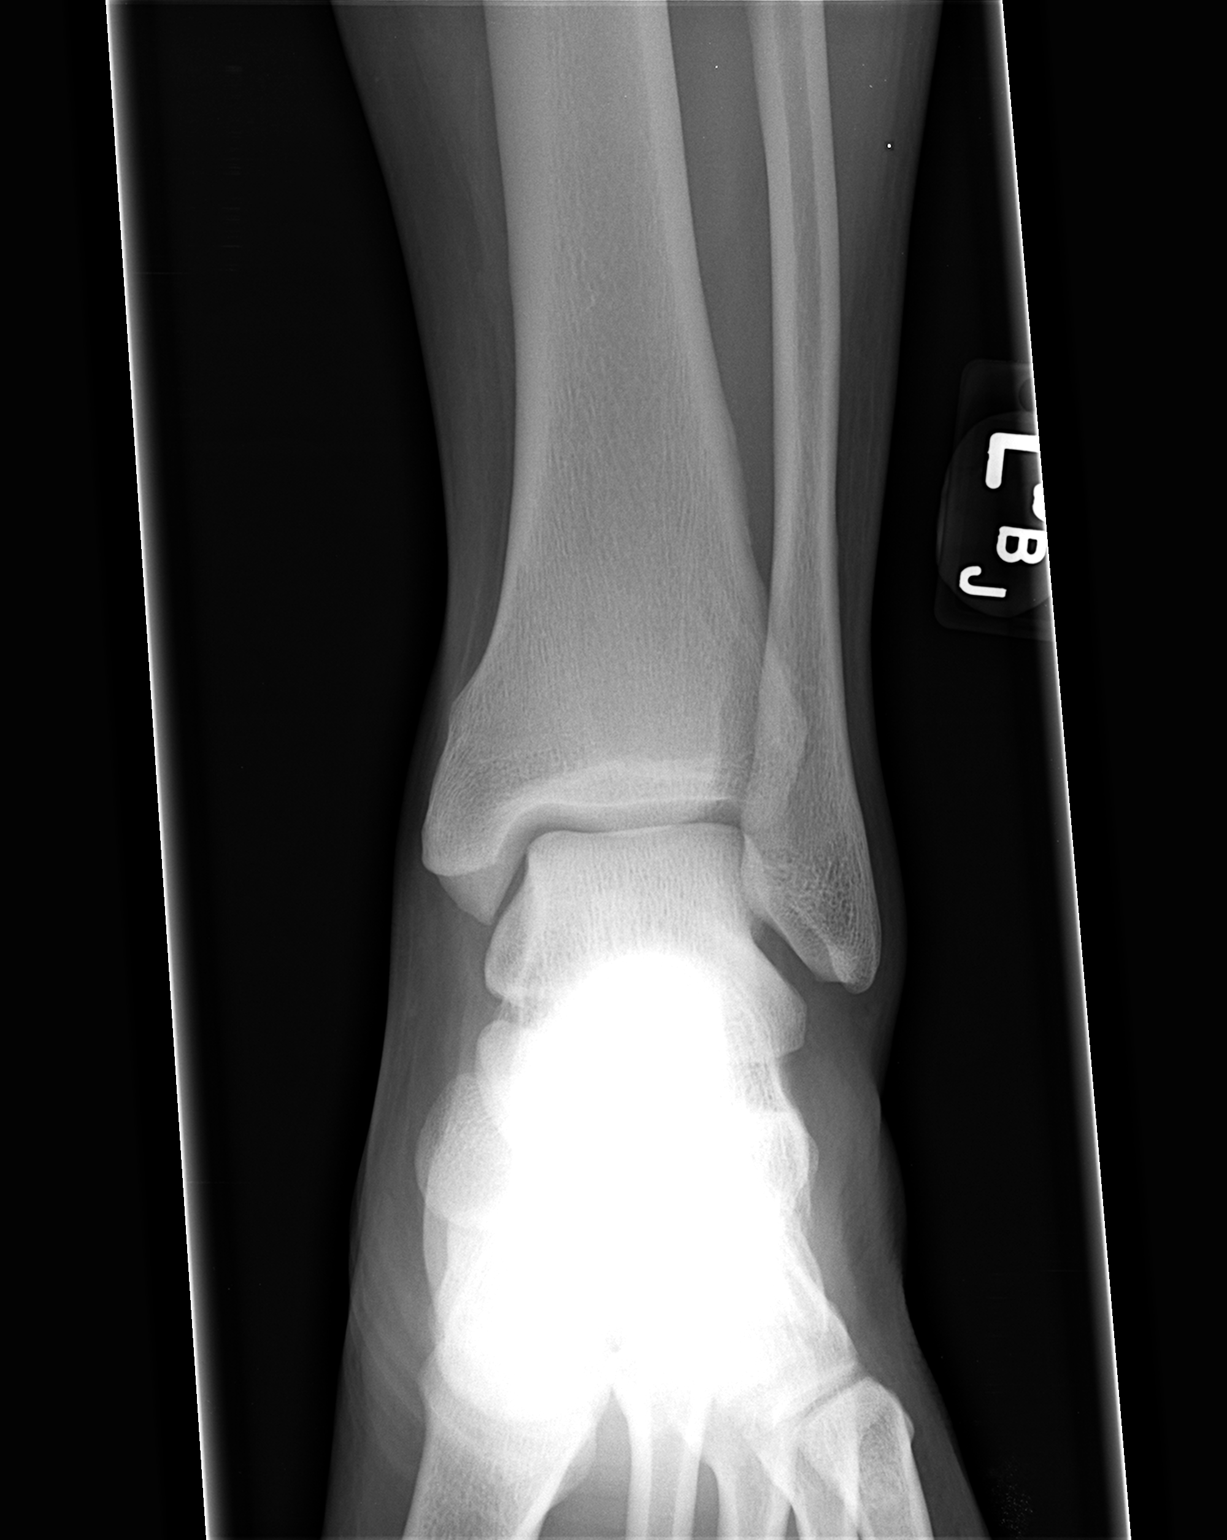

[view not recorded (2 of 3)]
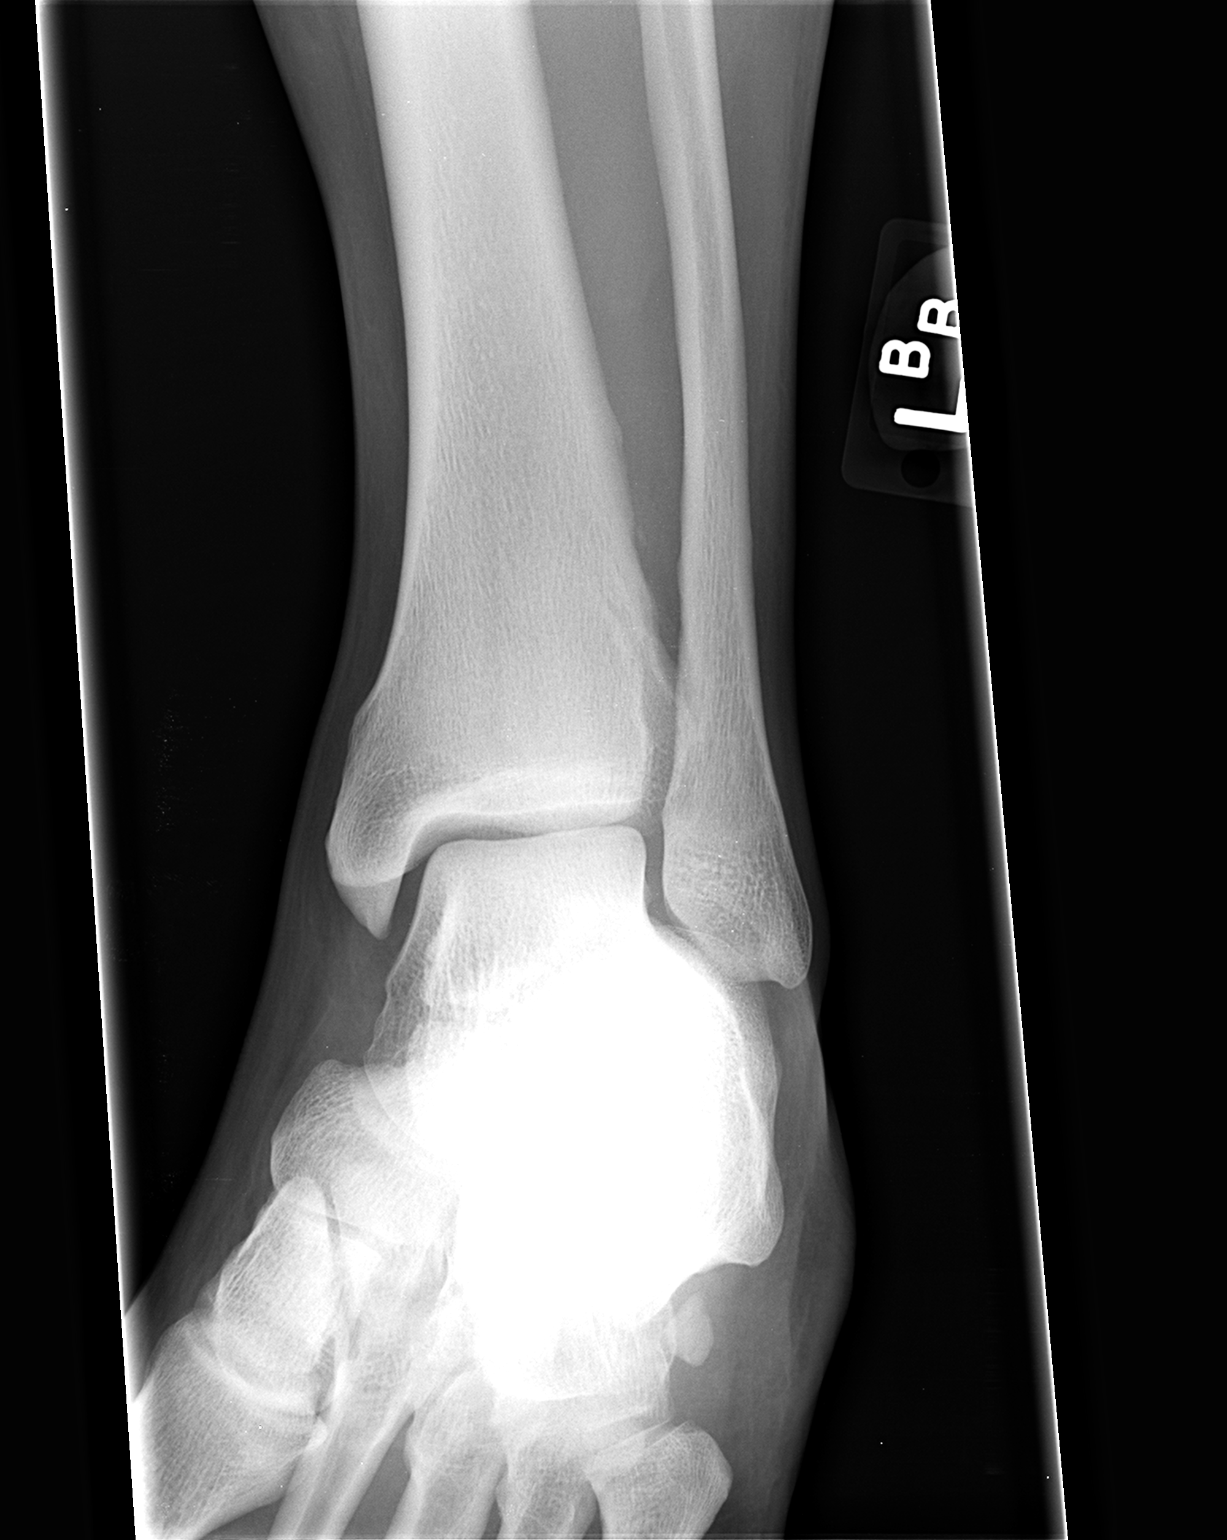

[view not recorded (3 of 3)]
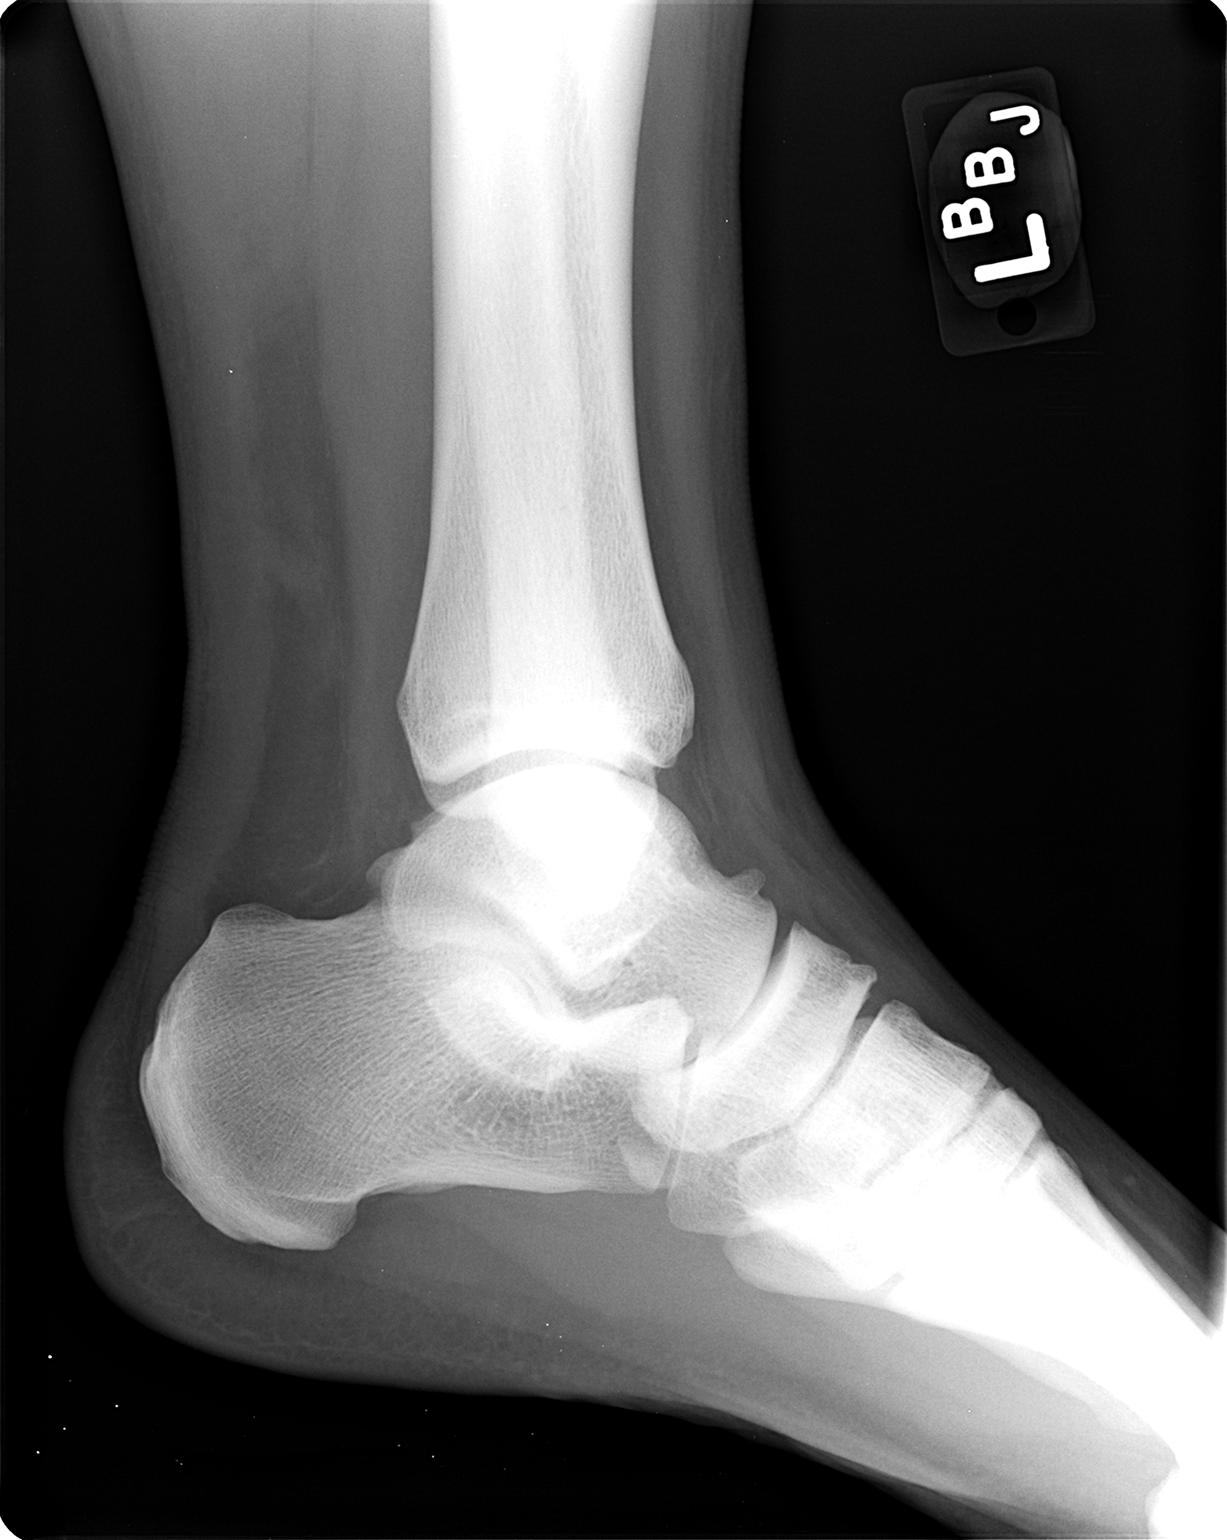

[3 of 3 positions shown; findings below may reference images not displayed]

FINDINGS: There is no evidence of fracture, dislocation, or joint effusion.
There is no evidence of arthropathy or other focal bone abnormality.
Mild irregularity of the anterior aspect of the Achilles tendon with
edema in Kager's fat.
IMPRESSION: Mild irregularity of the anterior aspect of the Achilles tendon with
edema in Kager's fat as can be seen with an Achilles injury.

## 2017-12-06 ENCOUNTER — Other Ambulatory Visit: Payer: Self-pay

## 2017-12-06 ENCOUNTER — Encounter (HOSPITAL_COMMUNITY): Payer: Self-pay | Admitting: Emergency Medicine

## 2017-12-06 DIAGNOSIS — R739 Hyperglycemia, unspecified: Secondary | ICD-10-CM | POA: Insufficient documentation

## 2017-12-06 DIAGNOSIS — I951 Orthostatic hypotension: Secondary | ICD-10-CM | POA: Diagnosis not present

## 2017-12-06 DIAGNOSIS — R42 Dizziness and giddiness: Secondary | ICD-10-CM | POA: Diagnosis present

## 2017-12-06 NOTE — ED Triage Notes (Signed)
Pt c/o intermittent dizziness x 4 days. Pt states the dizziness is worse when he walks around.

## 2017-12-07 ENCOUNTER — Emergency Department (HOSPITAL_COMMUNITY)
Admission: EM | Admit: 2017-12-07 | Discharge: 2017-12-07 | Disposition: A | Discharge status: Home / Self Care | Payer: BLUE CROSS/BLUE SHIELD | Attending: Emergency Medicine | Admitting: Emergency Medicine

## 2017-12-07 DIAGNOSIS — R739 Hyperglycemia, unspecified: Secondary | ICD-10-CM

## 2017-12-07 DIAGNOSIS — I951 Orthostatic hypotension: Secondary | ICD-10-CM

## 2017-12-07 LAB — BLOOD GAS, VENOUS
Acid-Base Excess: 3 mmol/L — ABNORMAL HIGH (ref 0.0–2.0)
Bicarbonate: 24.8 mmol/L (ref 20.0–28.0)
FIO2: 21
O2 Saturation: 54.8 %
PCO2 VEN: 52.9 mmHg (ref 44.0–60.0)
PO2 VEN: 32.6 mmHg (ref 32.0–45.0)
pH, Ven: 7.346 (ref 7.250–7.430)

## 2017-12-07 LAB — CBC WITH DIFFERENTIAL/PLATELET
BASOS PCT: 0 %
Basophils Absolute: 0 10*3/uL (ref 0.0–0.1)
EOS PCT: 1 %
Eosinophils Absolute: 0.1 10*3/uL (ref 0.0–0.7)
HCT: 46.4 % (ref 39.0–52.0)
Hemoglobin: 15.2 g/dL (ref 13.0–17.0)
Lymphocytes Relative: 35 %
Lymphs Abs: 2.9 10*3/uL (ref 0.7–4.0)
MCH: 28.1 pg (ref 26.0–34.0)
MCHC: 32.8 g/dL (ref 30.0–36.0)
MCV: 85.8 fL (ref 78.0–100.0)
MONOS PCT: 7 %
Monocytes Absolute: 0.6 10*3/uL (ref 0.1–1.0)
NEUTROS PCT: 57 %
Neutro Abs: 4.9 10*3/uL (ref 1.7–7.7)
PLATELETS: 360 10*3/uL (ref 150–400)
RBC: 5.41 MIL/uL (ref 4.22–5.81)
RDW: 13.6 % (ref 11.5–15.5)
WBC: 8.5 10*3/uL (ref 4.0–10.5)

## 2017-12-07 LAB — RAPID URINE DRUG SCREEN, HOSP PERFORMED
Amphetamines: NOT DETECTED
Barbiturates: NOT DETECTED
Benzodiazepines: NOT DETECTED
Cocaine: NOT DETECTED
OPIATES: NOT DETECTED
Tetrahydrocannabinol: NOT DETECTED

## 2017-12-07 LAB — URINALYSIS, ROUTINE W REFLEX MICROSCOPIC
BILIRUBIN URINE: NEGATIVE
Bacteria, UA: NONE SEEN
GLUCOSE, UA: 50 mg/dL — AB
Hgb urine dipstick: NEGATIVE
Ketones, ur: 5 mg/dL — AB
LEUKOCYTES UA: NEGATIVE
Nitrite: NEGATIVE
PH: 5 (ref 5.0–8.0)
Protein, ur: NEGATIVE mg/dL
SPECIFIC GRAVITY, URINE: 1.02 (ref 1.005–1.030)

## 2017-12-07 LAB — BASIC METABOLIC PANEL
Anion gap: 7 (ref 5–15)
BUN: 13 mg/dL (ref 6–20)
CALCIUM: 9.4 mg/dL (ref 8.9–10.3)
CHLORIDE: 101 mmol/L (ref 98–111)
CO2: 29 mmol/L (ref 22–32)
CREATININE: 1.1 mg/dL (ref 0.61–1.24)
GFR calc Af Amer: >60 mL/min (ref >60)
GFR calc non Af Amer: >60 mL/min (ref >60)
Glucose, Bld: 240 mg/dL — ABNORMAL HIGH (ref 70–99)
Potassium: 3.8 mmol/L (ref 3.5–5.1)
SODIUM: 137 mmol/L (ref 135–145)

## 2017-12-07 LAB — CK: Total CK: 266 U/L (ref 49–397)

## 2017-12-07 LAB — I-STAT CG4 LACTIC ACID, ED: Lactic Acid, Venous: 1.05 mmol/L (ref 0.5–1.9)

## 2017-12-07 LAB — CBG MONITORING, ED
GLUCOSE-CAPILLARY: 213 mg/dL — AB (ref 70–99)
GLUCOSE-CAPILLARY: 176 mg/dL — AB (ref 70–99)

## 2017-12-07 MED ORDER — FREESTYLE SYSTEM KIT: 1.0000 | PACK | 0 refills | Status: AC | PRN

## 2017-12-07 MED ORDER — SODIUM CHLORIDE 0.9 % IV BOLUS
1000.0000 mL | Freq: Once | INTRAVENOUS | Status: AC
  Administered 2017-12-07: 1000 mL via INTRAVENOUS

## 2017-12-07 NOTE — ED Provider Notes (Signed)
[facility] EMERGENCY DEPARTMENT Provider Note   CSN: [id number] Arrival date & time: 12/06/17  2148     History   Chief Complaint Chief Complaint  Patient presents with  . Dizziness    HPI Bruce [name] is a 32 y.o. male.  Patient with a 3 to 4-day history of "dizziness" that is worse when he changes positions or goes from sitting to standing.  He denies any vertigo.  Denies any focal weakness, numbness or tingling.  Denies any chest pain, shortness of breath, headache or visual changes.  Patient works as a Materials engineer.  States his been eating and drinking well.  He believes he has been staying hydrated.  Denies any abdominal pain, vomiting or diarrhea.  No chest pain or shortness of breath. Does not have any chronic medical conditions or take any medications.  States he checked his blood sugar with his relatives meter several days ago and was over 300.  He denies any history of diabetes.  The history is provided by the patient and a relative.  Dizziness  Associated symptoms: no nausea, no shortness of breath, no vomiting and no weakness     History reviewed. No pertinent past medical history.  There are no active problems to display for this patient.   Past Surgical History:  Procedure Laterality Date  . ANKLE FRACTURE SURGERY    . HARDWARE REMOVAL  04/29/2011   Procedure: HARDWARE REMOVAL;  Surgeon: Darreld Mclean;  Location: [facility];  Service: Orthopedics;  Laterality: Right;  Removal Screw Right Ankle; status post right ankle fracture  . ORIF ANKLE FRACTURE  03/02/2011   Procedure: OPEN REDUCTION INTERNAL FIXATION (ORIF) ANKLE FRACTURE;  Surgeon: Darreld Mclean;  Location: [facility];  Service: Orthopedics;  Laterality: Right;  . WISDOM TOOTH EXTRACTION          Home Medications    Prior to Admission medications   Medication Sig Start Date End Date Taking? Authorizing Provider  HYDROcodone-acetaminophen (NORCO) 7.5-325 MG per tablet Take 1 tablet  by mouth every 4 (four) hours as needed. For pain     [provider]  oxyCODONE-acetaminophen (PERCOCET/ROXICET) 5-325 MG per tablet Take 2 tablets by mouth every 4 (four) hours as needed for moderate pain or severe pain. 03/21/14   Janne Napoleon, NP    Family History Family History  Problem Relation Age of Onset  . Diabetes Mother   . Anesthesia problems Neg Hx   . Hypotension Neg Hx   . Malignant hyperthermia Neg Hx   . Pseudochol deficiency Neg Hx     Social History Social History   Tobacco Use  . Smoking status: Never Smoker  . Smokeless tobacco: Never Used  Substance Use Topics  . Alcohol use: No  . Drug use: No     Allergies   Patient has no known allergies.   Review of Systems Review of Systems  Constitutional: Positive for activity change, appetite change and fatigue. Negative for fever.  HENT: Negative for congestion, dental problem, rhinorrhea, sinus pressure and trouble swallowing.   Eyes: Negative for visual disturbance.  Respiratory: Negative for cough, chest tightness and shortness of breath.   Gastrointestinal: Negative for abdominal pain, nausea and vomiting.  Genitourinary: Negative for dysuria, frequency, hematuria, scrotal swelling and urgency.  Musculoskeletal: Negative for arthralgias and myalgias.  Neurological: Positive for dizziness and light-headedness. Negative for seizures, facial asymmetry, speech difficulty and weakness.  Psychiatric/Behavioral: Negative for agitation, hallucinations and suicidal ideas.   all other systems  are negative except as noted in the HPI and PMH.    Physical Exam Updated Vital Signs BP 122/66 (BP Location: Right Arm)   Pulse (!) 103   Temp 98.3 F (36.8 C) (Oral)   Resp 16   Ht 5\' 11"  (1.803 m)   Wt (!) 147.4 kg (325 lb)   SpO2 100%   BMI 45.33 kg/m   Physical Exam  Constitutional: He is oriented to person, place, and time. He appears well-developed and well-nourished. No distress.  obese    HENT:  Head: Normocephalic and atraumatic.  Mouth/Throat: Oropharynx is clear and moist. No oropharyngeal exudate.  Eyes: Pupils are equal, round, and reactive to light. Conjunctivae and EOM are normal.  Neck: Normal range of motion. Neck supple.  No meningismus.  Cardiovascular: Normal rate, regular rhythm, normal heart sounds and intact distal pulses.  No murmur heard. Pulmonary/Chest: Effort normal and breath sounds normal. No respiratory distress.  Abdominal: Soft. There is no tenderness. There is no rebound and no guarding.  Musculoskeletal: Normal range of motion. He exhibits no edema or tenderness.  Neurological: He is alert and oriented to person, place, and time. No cranial nerve deficit. He exhibits normal muscle tone. Coordination normal.  No ataxia on finger to nose bilaterally. No pronator drift. 5/5 strength throughout. CN 2-12 intact.Equal grip strength. Sensation intact.  No nystagmus. Head impulse testing negative.  Skin: Skin is warm.  Psychiatric: He has a normal mood and affect. His behavior is normal.  Nursing note and vitals reviewed.    ED Treatments / Results  Labs (all labs ordered are listed, but only abnormal results are displayed) Labs Reviewed  BASIC METABOLIC PANEL - Abnormal; Notable for the following components:      Result Value   Glucose, Bld 240 (*)    All other components within normal limits  BLOOD GAS, VENOUS - Abnormal; Notable for the following components:   Acid-Base Excess 3.0 (*)    All other components within normal limits  CBG MONITORING, ED - Abnormal; Notable for the following components:   Glucose-Capillary 213 (*)    All other components within normal limits  CBG MONITORING, ED - Abnormal; Notable for the following components:   Glucose-Capillary 176 (*)    All other components within normal limits  CBC WITH DIFFERENTIAL/PLATELET  RAPID URINE DRUG SCREEN, HOSP PERFORMED  CK  URINALYSIS, ROUTINE W REFLEX MICROSCOPIC  I-STAT  CG4 LACTIC ACID, ED    EKG EKG Interpretation  Date/Time:  Wednesday December 07 2017 01:03:21 EDT Ventricular Rate:  85 PR Interval:    QRS Duration: 105 QT Interval:  346 QTC Calculation: 412 R Axis:   109 Text Interpretation:  Sinus rhythm Right axis deviation No previous ECGs available Confirmed by Glynn Octaveancour, Momina Hunton [phone]([id number]) on 12/07/2017 1:09:24 AM   Radiology No results found.  Procedures Procedures (including critical care time)  Medications Ordered in ED Medications  sodium chloride 0.9 % bolus 1,000 mL (has no administration in time range)  sodium chloride 0.9 % bolus 1,000 mL (has no administration in time range)     Initial Impression / Assessment and Plan / ED Course  I have reviewed the triage vital signs and the nursing notes.  Pertinent labs & imaging results that were available during my care of the patient were reviewed by me and considered in my medical decision making (see chart for details).    Patient presents with 3-day history of intermittent dizziness and lightheadedness.  Denies pain.  Denies any focal  weakness, numbness or tingling.  No chest pain or shortness of breath.  Orthostatics are positive and patient is given IV fluids.  CBG is 240 on arrival without any known history of diabetes.  EKG is sinus rhythm without evidence of Brugada or prolonged QT. Patient denies chest pain or shortness of breath.  Patient aggressively hydrated due to his hyperglycemia without evidence of DKA and positive orthostatics. Blood sugar has downtrended to 176.  D/w patient concern that he is diabetic. Needs obtain glucometer and establish care with PCP.  Patient aggressively hydrated.  He declines being started on metformin for his hyperglycemia as he is concerned about hypoglycemia and wishes to establish care with a PCP first.  He is advised to obtain a glucometer check his blood sugar at the same time every day.  Advised to increase his hydration at home.   Especially in the hot weather. Discussed with patient that he needs to establish care with PCP.  Return to the ED with worsening symptoms including chest pain, nausea, vomiting or any other concerns. Final Clinical Impressions(s) / ED Diagnoses   Final diagnoses:  Orthostasis  Hyperglycemia    ED Discharge Orders    None       Antoinette Borgwardt, Jeannett Senior, MD 12/07/17 [phone]

## 2017-12-07 NOTE — Discharge Instructions (Addendum)
Your dizziness is likely due to dehydration.  As we discussed your blood sugar is high and it is possibility that you are diabetic.  You declined to being started on medication today.  He should obtain a glucose meter and check your blood sugar at the same time every day.  Follow-up with the primary doctor to further address your high blood sugar.  Return to the ED if you develop chest pain, shortness of breath, persistent dizziness or any other concerns.

## 2018-11-20 ENCOUNTER — Other Ambulatory Visit: Payer: Self-pay

## 2018-11-20 ENCOUNTER — Other Ambulatory Visit: Payer: BLUE CROSS/BLUE SHIELD

## 2018-11-20 DIAGNOSIS — Z20822 Contact with and (suspected) exposure to covid-19: Secondary | ICD-10-CM

## 2018-11-23 LAB — NOVEL CORONAVIRUS, NAA: SARS-CoV-2, NAA: NOT DETECTED

## 2018-11-27 ENCOUNTER — Telehealth: Payer: Self-pay | Admitting: *Deleted

## 2018-11-27 NOTE — Telephone Encounter (Signed)
Patient informed of COVID results °

## 2019-03-06 ENCOUNTER — Other Ambulatory Visit: Payer: Self-pay

## 2019-03-06 DIAGNOSIS — Z20822 Contact with and (suspected) exposure to covid-19: Secondary | ICD-10-CM

## 2019-03-07 LAB — NOVEL CORONAVIRUS, NAA: SARS-CoV-2, NAA: NOT DETECTED

## 2019-03-08 ENCOUNTER — Telehealth: Payer: Self-pay | Admitting: *Deleted

## 2019-03-08 NOTE — Telephone Encounter (Signed)
Patient called in stating he is needing to have his results faxed to his job at [phone], attention Ms.Andres Ege. Please advise.

## 2019-03-08 NOTE — Telephone Encounter (Signed)
Patient is calling back to receive his negative COVID test results. Patient expressed understanding. °

## 2020-02-22 ENCOUNTER — Other Ambulatory Visit: Payer: Self-pay

## 2020-02-22 DIAGNOSIS — Z20822 Contact with and (suspected) exposure to covid-19: Secondary | ICD-10-CM

## 2020-02-23 LAB — SARS-COV-2, NAA 2 DAY TAT

## 2020-02-23 LAB — NOVEL CORONAVIRUS, NAA: SARS-CoV-2, NAA: NOT DETECTED

## 2020-10-30 ENCOUNTER — Ambulatory Visit: Payer: Self-pay | Admitting: Orthopaedic Surgery

## 2020-10-30 ENCOUNTER — Encounter: Payer: Self-pay | Admitting: Orthopaedic Surgery

## 2020-11-25 ENCOUNTER — Ambulatory Visit: Payer: Self-pay | Admitting: Orthopaedic Surgery

## 2020-12-02 ENCOUNTER — Ambulatory Visit: Payer: Self-pay | Admitting: Orthopaedic Surgery

## 2022-01-11 ENCOUNTER — Encounter (HOSPITAL_COMMUNITY): Payer: Self-pay

## 2022-01-11 ENCOUNTER — Emergency Department (HOSPITAL_COMMUNITY)
Admission: EM | Admit: 2022-01-11 | Discharge: 2022-01-11 | Disposition: A | Discharge status: Home / Self Care | Payer: 59 | Attending: Emergency Medicine | Admitting: Emergency Medicine

## 2022-01-11 ENCOUNTER — Emergency Department (HOSPITAL_COMMUNITY): Payer: 59

## 2022-01-11 ENCOUNTER — Other Ambulatory Visit: Payer: Self-pay

## 2022-01-11 DIAGNOSIS — H81392 Other peripheral vertigo, left ear: Secondary | ICD-10-CM | POA: Diagnosis not present

## 2022-01-11 DIAGNOSIS — R519 Headache, unspecified: Secondary | ICD-10-CM | POA: Insufficient documentation

## 2022-01-11 DIAGNOSIS — E119 Type 2 diabetes mellitus without complications: Secondary | ICD-10-CM | POA: Insufficient documentation

## 2022-01-11 DIAGNOSIS — R42 Dizziness and giddiness: Secondary | ICD-10-CM | POA: Diagnosis present

## 2022-01-11 HISTORY — DX: Hyperlipidemia, unspecified: E78.5

## 2022-01-11 HISTORY — DX: Type 2 diabetes mellitus without complications: E11.9

## 2022-01-11 LAB — BASIC METABOLIC PANEL
Anion gap: 7 (ref 5–15)
BUN: 10 mg/dL (ref 6–20)
CO2: 27 mmol/L (ref 22–32)
Calcium: 9.2 mg/dL (ref 8.9–10.3)
Chloride: 104 mmol/L (ref 98–111)
Creatinine, Ser: 1.11 mg/dL (ref 0.61–1.24)
GFR, Estimated: >60 mL/min (ref >60)
Glucose, Bld: 120 mg/dL — ABNORMAL HIGH (ref 70–99)
Potassium: 3.9 mmol/L (ref 3.5–5.1)
Sodium: 138 mmol/L (ref 135–145)

## 2022-01-11 LAB — CBC
HCT: 45.6 % (ref 39.0–52.0)
Hemoglobin: 14.6 g/dL (ref 13.0–17.0)
MCH: 28.6 pg (ref 26.0–34.0)
MCHC: 32 g/dL (ref 30.0–36.0)
MCV: 89.4 fL (ref 80.0–100.0)
Platelets: 329 10*3/uL (ref 150–400)
RBC: 5.1 MIL/uL (ref 4.22–5.81)
RDW: 14.1 % (ref 11.5–15.5)
WBC: 6 10*3/uL (ref 4.0–10.5)
nRBC: 0 % (ref 0.0–0.2)

## 2022-01-11 LAB — CBG MONITORING, ED: Glucose-Capillary: 87 mg/dL (ref 70–99)

## 2022-01-11 MED ORDER — MECLIZINE HCL 25 MG PO TABS: 25.0000 mg | ORAL_TABLET | Freq: Three times a day (TID) | ORAL | 0 refills | Status: DC | PRN

## 2022-01-11 MED ORDER — MECLIZINE HCL 25 MG PO TABS: 25.0000 mg | ORAL_TABLET | Freq: Three times a day (TID) | ORAL | 0 refills | Status: AC | PRN

## 2022-01-11 MED ORDER — MECLIZINE HCL 12.5 MG PO TABS
25.0000 mg | ORAL_TABLET | Freq: Once | ORAL | Status: AC
  Administered 2022-01-11: 25 mg via ORAL
  Filled 2022-01-11: qty 2

## 2022-01-11 MED ORDER — IOHEXOL 350 MG/ML SOLN
100.0000 mL | Freq: Once | INTRAVENOUS | Status: AC | PRN
  Administered 2022-01-11: 75 mL via INTRAVENOUS

## 2022-01-11 NOTE — ED Triage Notes (Signed)
Patient states dizziness followed by nausea and headache for about a month since a trip to [place], where he flew.

## 2022-01-11 NOTE — ED Provider Notes (Signed)
[facility] EMERGENCY DEPARTMENT Provider Note   CSN: [id number] Arrival date & time: 01/11/22  0814     History Chief Complaint  Patient presents with   Dizziness    HPI Bruce [name] is a 36 y.o. male presenting for episodic dizziness over the past 10 days.  He endorses feelings of spinning while he is trying to work over the past week.  Is been accompanied by intermittent headaches and intermittent nausea.  He states when he is not moving he is asymptomatic, however he works as a [employer] and when he is driving he becomes floridly symptomatic. Family history of vertigo.  Personal history of diabetes.  He does endorse headaches over this time period as well which are new.  Denies any neck trauma or neck manipulation. Headaches are currently not present but intermittent in nature radiating from the base of his neck bilaterally. Patient's recorded medical, surgical, social, medication list and allergies were reviewed in the Snapshot window as part of the initial history.   Review of Systems   Review of Systems  Constitutional:  Negative for chills and fever.  HENT:  Negative for ear pain and sore throat.   Eyes:  Negative for pain and visual disturbance.  Respiratory:  Negative for cough and shortness of breath.   Cardiovascular:  Negative for chest pain and palpitations.  Gastrointestinal:  Negative for abdominal pain and vomiting.  Genitourinary:  Negative for dysuria and hematuria.  Musculoskeletal:  Negative for arthralgias and back pain.  Skin:  Negative for color change and rash.  Neurological:  Positive for dizziness and light-headedness. Negative for seizures and syncope.  All other systems reviewed and are negative.   Physical Exam Updated Vital Signs BP (!) 147/99   Pulse 96   Temp 98.7 F (37.1 C) (Oral)   Resp (!) 22   Ht 5\' 11"  (1.803 m)   Wt (!) 149.7 kg   SpO2 100%   BMI 46.03 kg/m  Physical Exam Vitals and nursing note reviewed.   Constitutional:      General: He is not in acute distress.    Appearance: He is well-developed.  HENT:     Head: Normocephalic and atraumatic.  Eyes:     Conjunctiva/sclera: Conjunctivae normal.  Cardiovascular:     Rate and Rhythm: Normal rate and regular rhythm.     Heart sounds: No murmur heard. Pulmonary:     Effort: Pulmonary effort is normal. No respiratory distress.     Breath sounds: Normal breath sounds.  Abdominal:     Palpations: Abdomen is soft.     Tenderness: There is no abdominal tenderness.  Musculoskeletal:        General: No swelling.     Cervical back: Neck supple.  Skin:    General: Skin is warm and dry.     Capillary Refill: Capillary refill takes less than 2 seconds.  Neurological:     General: No focal deficit present.     Mental Status: He is alert and oriented to person, place, and time.     Cranial Nerves: No cranial nerve deficit.     Motor: No weakness.     Gait: Gait normal.     Comments: On exam, patient has leftward beats of nystagmus on rightward eye gaze deviation.  Negative test of skew.  On head impulse test, he has a corrective saccade to the left.  Psychiatric:        Mood and Affect: Mood normal.  ED Course/ Medical Decision Making/ A&P    Procedures Procedures   Medications Ordered in ED Medications  meclizine (ANTIVERT) tablet 25 mg (25 mg Oral Given 01/11/22 0901)  iohexol (OMNIPAQUE) 350 MG/ML injection 100 mL (75 mLs Intravenous Contrast Given 01/11/22 1034)    Medical Decision Making:    Bruce [name] is a 36 y.o. male who presented to the ED today with dizziness detailed above.     Patient's presentation is complicated by their history of diabetes.  Patient placed on continuous vitals and telemetry monitoring while in ED which was reviewed periodically.   Complete initial physical exam performed, notably the patient  was hemodynamically stable in no acute distress.  He has a reassuring hints exam with positive  left beats of nystagmus and a positive corrective COVID as well as a negative test of skew.      Reviewed and confirmed nursing documentation for past medical history, family history, social history.    Initial Assessment:   With the patient's presentation of paroxysms of dizziness, most likely diagnosis is benign positional paroxysmal vertigo versus vestibular migraine or vestibular neuritis.  Other diagnoses were considered including (but not limited to) CVA, carotid dissection, vertebral artery dissection, intra cranial aneurysm, cardiogenic etiology of dizziness. These are considered less likely due to history of present illness and physical exam findings.   This is most consistent with an acute life/limb threatening illness complicated by underlying chronic conditions.  Initial Plan:  CTA head neck to evaluate for cause of dizziness with headache Screening labs including CBC and Metabolic panel to evaluate for infectious or metabolic etiology of disease.   EKG to evaluate for cardiac pathology. Objective evaluation as below reviewed with plan for close reassessment  Initial Study Results:   Laboratory  All laboratory results reviewed without evidence of clinically relevant pathology.    EKG EKG was reviewed independently. Rate, rhythm, axis, intervals all examined and without medically relevant abnormality. ST segments without concerns for elevations.    Radiology  All images reviewed independently. Agree with radiology report at this time.   CT ANGIO HEAD NECK W WO CM  Result Date: 01/11/2022 CLINICAL DATA:  Sudden, severe headache. Intermittent dizziness and headache for 1 month, worse for the past 2 weeks EXAM: CT ANGIOGRAPHY HEAD AND NECK TECHNIQUE: Multidetector CT imaging of the head and neck was performed using the standard protocol during bolus administration of intravenous contrast. Multiplanar CT image reconstructions and MIPs were obtained to evaluate the vascular anatomy.  Carotid stenosis measurements (when applicable) are obtained utilizing NASCET criteria, using the distal internal carotid diameter as the denominator. RADIATION DOSE REDUCTION: This exam was performed according to the departmental dose-optimization program which includes automated exposure control, adjustment of the mA and/or kV according to patient size and/or use of iterative reconstruction technique. CONTRAST:  64mL OMNIPAQUE IOHEXOL 350 MG/ML SOLN COMPARISON:  None Available. FINDINGS: CT HEAD FINDINGS Brain: No evidence of acute infarction, hemorrhage, hydrocephalus, extra-axial collection or mass lesion/mass effect. Vascular: See below Skull: Normal. Negative for fracture or focal lesion. Sinuses/Orbits: No acute finding. Review of the MIP images confirms the above findings CTA NECK FINDINGS Aortic arch: Normal with 3 vessel branching. Right carotid system: Vessels are smoothly contoured and widely patent Left carotid system: Vessels are smoothly contoured and widely patent Vertebral arteries: No proximal subclavian stenosis. The vertebral arteries are smoothly contoured and widely patent. Skeleton: No acute or aggressive finding. Dental caries with right maxillary molar periapical lucency. Other neck: Unremarkable Upper chest:  Negative Review of the MIP images confirms the above findings CTA HEAD FINDINGS Anterior circulation: No major branch occlusion, beading, stenosis, or aneurysm. Posterior circulation: Vertebral and basilar arteries are smoothly contoured and widely patent. No branch occlusion, beading, or aneurysm Venous sinuses: Diffusely patent Anatomic variants: None significant Review of the MIP images confirms the above findings IMPRESSION: Normal CTA of the head and neck. Electronically Signed   By: Tiburcio Pea M.D.   On: 01/11/2022 10:42      Final Assessment and Plan:   On repeat assessment, patient states he currently is asymptomatic.  The meclizine seems to have completely stabilized  his symptoms.  Given the duration of symptoms, reassuring angiography and imaging of the head, I do not favor subacute stroke or central etiology for patient's symptoms.  His hints exam localizes to lateral vestibular system as well.  We will prescribe patient meclizine to use in the outpatient setting recommend close follow-up with primary care provider for reassessment.  Appointment already scheduled for 9 days from now.  Patient otherwise ambulatory tolerating p.o. intake in no acute distress stable for continued outpatient care and management.  Patient discharged with no further acute events.   Clinical Impression:  1. Peripheral vertigo involving left ear      Data Unavailable   Final Clinical Impression(s) / ED Diagnoses Final diagnoses:  Peripheral vertigo involving left ear    Rx / DC Orders ED Discharge Orders     None         Glyn Ade, MD 01/11/22 1101

## 2022-01-11 NOTE — ED Notes (Signed)
[facility] unable to sign MSE penpad due to the penpad not working. Patient verbalized agreement to MSE screening.

## 2022-07-01 ENCOUNTER — Encounter: Payer: Self-pay | Admitting: Radiology

## 2022-08-01 ENCOUNTER — Encounter (HOSPITAL_COMMUNITY): Payer: Self-pay | Admitting: Emergency Medicine

## 2022-08-01 ENCOUNTER — Emergency Department (HOSPITAL_COMMUNITY): Payer: 59

## 2022-08-01 ENCOUNTER — Emergency Department (HOSPITAL_COMMUNITY)
Admission: EM | Admit: 2022-08-01 | Discharge: 2022-08-01 | Disposition: A | Discharge status: Home / Self Care | Payer: Self-pay | Attending: Emergency Medicine | Admitting: Emergency Medicine

## 2022-08-01 ENCOUNTER — Other Ambulatory Visit: Payer: Self-pay

## 2022-08-01 DIAGNOSIS — R1010 Upper abdominal pain, unspecified: Secondary | ICD-10-CM

## 2022-08-01 DIAGNOSIS — K760 Fatty (change of) liver, not elsewhere classified: Secondary | ICD-10-CM | POA: Insufficient documentation

## 2022-08-01 DIAGNOSIS — Z7984 Long term (current) use of oral hypoglycemic drugs: Secondary | ICD-10-CM | POA: Insufficient documentation

## 2022-08-01 DIAGNOSIS — R109 Unspecified abdominal pain: Secondary | ICD-10-CM

## 2022-08-01 LAB — CBC WITH DIFFERENTIAL/PLATELET
Abs Immature Granulocytes: 0.03 10*3/uL (ref 0.00–0.07)
Basophils Absolute: 0 10*3/uL (ref 0.0–0.1)
Basophils Relative: 0 %
Eosinophils Absolute: 0 10*3/uL (ref 0.0–0.5)
Eosinophils Relative: 1 %
HCT: 43.6 % (ref 39.0–52.0)
Hemoglobin: 14 g/dL (ref 13.0–17.0)
Immature Granulocytes: 0 %
Lymphocytes Relative: 44 %
Lymphs Abs: 3.9 10*3/uL (ref 0.7–4.0)
MCH: 28 pg (ref 26.0–34.0)
MCHC: 32.1 g/dL (ref 30.0–36.0)
MCV: 87.2 fL (ref 80.0–100.0)
Monocytes Absolute: 0.5 10*3/uL (ref 0.1–1.0)
Monocytes Relative: 6 %
Neutro Abs: 4.4 10*3/uL (ref 1.7–7.7)
Neutrophils Relative %: 49 %
Platelets: 333 10*3/uL (ref 150–400)
RBC: 5 MIL/uL (ref 4.22–5.81)
RDW: 14.3 % (ref 11.5–15.5)
WBC: 8.8 10*3/uL (ref 4.0–10.5)
nRBC: 0 % (ref 0.0–0.2)

## 2022-08-01 LAB — COMPREHENSIVE METABOLIC PANEL
ALT: 60 U/L — ABNORMAL HIGH (ref 0–44)
AST: 53 U/L — ABNORMAL HIGH (ref 15–41)
Albumin: 3.8 g/dL (ref 3.5–5.0)
Alkaline Phosphatase: 52 U/L (ref 38–126)
Anion gap: 5 (ref 5–15)
BUN: 13 mg/dL (ref 6–20)
CO2: 30 mmol/L (ref 22–32)
Calcium: 8.6 mg/dL — ABNORMAL LOW (ref 8.9–10.3)
Chloride: 99 mmol/L (ref 98–111)
Creatinine, Ser: 1.12 mg/dL (ref 0.61–1.24)
GFR, Estimated: >60 mL/min (ref >60)
Glucose, Bld: 220 mg/dL — ABNORMAL HIGH (ref 70–99)
Potassium: 3.7 mmol/L (ref 3.5–5.1)
Sodium: 134 mmol/L — ABNORMAL LOW (ref 135–145)
Total Bilirubin: 1.2 mg/dL (ref 0.3–1.2)
Total Protein: 7.1 g/dL (ref 6.5–8.1)

## 2022-08-01 LAB — URINALYSIS, ROUTINE W REFLEX MICROSCOPIC
Bilirubin Urine: NEGATIVE
Glucose, UA: 50 mg/dL — AB
Hgb urine dipstick: NEGATIVE
Ketones, ur: NEGATIVE mg/dL
Leukocytes,Ua: NEGATIVE
Nitrite: NEGATIVE
Protein, ur: NEGATIVE mg/dL
Specific Gravity, Urine: 1.021 (ref 1.005–1.030)
pH: 5 (ref 5.0–8.0)

## 2022-08-01 LAB — LIPASE, BLOOD: Lipase: 32 U/L (ref 11–51)

## 2022-08-01 MED ORDER — LIDOCAINE VISCOUS HCL 2 % MT SOLN
15.0000 mL | Freq: Once | OROMUCOSAL | Status: AC
  Administered 2022-08-01: 15 mL via ORAL
  Filled 2022-08-01: qty 15

## 2022-08-01 MED ORDER — FAMOTIDINE 20 MG PO TABS: 20.0000 mg | ORAL_TABLET | Freq: Two times a day (BID) | ORAL | 0 refills | Status: AC

## 2022-08-01 MED ORDER — ALUM & MAG HYDROXIDE-SIMETH 200-200-20 MG/5ML PO SUSP
30.0000 mL | Freq: Once | ORAL | Status: AC
  Administered 2022-08-01: 30 mL via ORAL
  Filled 2022-08-01: qty 30

## 2022-08-01 NOTE — ED Triage Notes (Signed)
Pt here from home with c/o right side pain after eating and lying down , just finished a steroid taper ,

## 2022-08-01 NOTE — ED Provider Notes (Signed)
[facility] Provider Note   CSN: [id number] Arrival date & time: 08/01/22  1931     History  No chief complaint on file.   Bruce [name] is a 37 y.o. male.  HPI   This is a 37 year old male presenting to the emergency department due to abdominal pain.  It started a week ago, to the right flank.  It is worse with movements, he is also having some discomfort in his upper abdomen after eating.  Is worse after eating spicy or tomato foods.  Times it is to the right upper quadrant but he has noticed after fatty food, he does have a gallbladder.  No previous abdominal surgeries, is not tried anything for the pain.  No dysuria, hematuria, nausea, vomiting, fevers.  Home Medications Prior to Admission medications   Medication Sig Start Date End Date Taking? Authorizing Provider  glipiZIDE (GLUCOTROL XL) 10 MG 24 hr tablet Take 10 mg by mouth daily. 10/12/21   [provider]  glucose monitoring kit (FREESTYLE) monitoring kit 1 each by Does not apply route as needed for other. 12/07/17   Rancour, Annie Main, MD  meclizine (ANTIVERT) 25 MG tablet Take 1 tablet (25 mg total) by mouth 3 (three) times daily as needed for dizziness. 01/11/22   Triplett, Tammy, PA-C  metFORMIN (GLUCOPHAGE-XR) 500 MG 24 hr tablet Take 1,000 mg by mouth daily. 12/15/21   [provider]  oxyCODONE-acetaminophen (PERCOCET/ROXICET) 5-325 MG per tablet Take 2 tablets by mouth every 4 (four) hours as needed for moderate pain or severe pain. Patient not taking: Reported on 01/11/2022 03/21/14   Debroah Baller M, NP  OZEMPIC, 0.25 OR 0.5 MG/DOSE, 2 MG/3ML SOPN Inject 0.25 mg into the skin once a week. 01/07/22   [provider]  rosuvastatin (CRESTOR) 5 MG tablet Take 1 tablet by mouth daily. 10/28/21   [provider]  RYBELSUS 14 MG TABS Take 1 tablet by mouth every morning. 10/12/21   [provider]  valACYclovir (VALTREX) 1000 MG tablet Take  1,000 mg by mouth daily. 10/28/21   [provider]  Vitamin D, Ergocalciferol, (DRISDOL) 1.25 MG (50000 UNIT) CAPS capsule Take 50,000 Units by mouth once a week. 10/12/21   [provider]      Allergies    Patient has no known allergies.    Review of Systems   Review of Systems  Physical Exam Updated Vital Signs BP (!) 151/88   Pulse 90   Temp 98.8 F (37.1 C) (Oral)   Resp 18   SpO2 95%  Physical Exam Vitals and nursing note reviewed. Exam conducted with a chaperone present.  Constitutional:      Appearance: Normal appearance.  HENT:     Head: Normocephalic and atraumatic.  Eyes:     General: No scleral icterus.       Right eye: No discharge.        Left eye: No discharge.     Extraocular Movements: Extraocular movements intact.     Pupils: Pupils are equal, round, and reactive to light.  Cardiovascular:     Rate and Rhythm: Normal rate and regular rhythm.     Pulses: Normal pulses.     Heart sounds: Normal heart sounds. No murmur heard.    No friction rub. No gallop.  Pulmonary:     Effort: Pulmonary effort is normal. No respiratory distress.     Breath sounds: Normal breath sounds.  Abdominal:  General: Abdomen is flat. Bowel sounds are normal. There is no distension.     Palpations: Abdomen is soft.     Tenderness: There is abdominal tenderness.     Comments: Mild epigastric tenderness, negative Murphy.  Some mild CVA tenderness.  Skin:    General: Skin is warm and dry.     Coloration: Skin is not jaundiced.  Neurological:     Mental Status: He is alert. Mental status is at baseline.     Coordination: Coordination normal.     ED Results / Procedures / Treatments   Labs (all labs ordered are listed, but only abnormal results are displayed) Labs Reviewed  URINALYSIS, ROUTINE W REFLEX MICROSCOPIC  CBC WITH DIFFERENTIAL/PLATELET  COMPREHENSIVE METABOLIC PANEL  LIPASE, BLOOD    EKG None  Radiology No results  found.  Procedures Procedures    Medications Ordered in ED Medications  alum & mag hydroxide-simeth (MAALOX/MYLANTA) 200-200-20 MG/5ML suspension 30 mL (has no administration in time range)    And  lidocaine (XYLOCAINE) 2 % viscous mouth solution 15 mL (has no administration in time range)    ED Course/ Medical Decision Making/ A&P Clinical Course as of 08/01/22 2144  Nancy Fetter Aug 01, 2022  2121 CT Renal Stone Study Mild hepatic steatosis.  No pancreatitis, no cholecystitis [HS]    Clinical Course User Index [HS] Sherrill Raring, PA-C                             Medical Decision Making Amount and/or Complexity of Data Reviewed Labs: ordered. Radiology: ordered. Decision-making details documented in ED Course.  Risk OTC drugs. Prescription drug management.   This is a 37 year old male presenting to the emergency department due to right flank pain.  Differential includes nephrolithiasis, UTI, pyelonephritis, myalgias.  He is also having upper abdominal pain after eating, this sounds mostly like GERD.  Cholecystitis is also a consideration as well as atypical pancreatitis.  I do not think this is ACS, is not having any chest pain, lung age, no cardiac risk factors.  Will start with labs, CT renal study and will reevaluate.  I ordered, viewed interpreted laboratory workup.  No leukocytosis or anemia, CMP without gross lecture derangement, AKI.  There are some mild transaminitis with AST 53 and ALT 60.  Lipase is within normal limits, consistent with pancreatitis.  UA pending  CT renal study shows hepatic steatosis.  No evidence of nephrolithiasis or perinephric stranding.  I ordered Maalox for the patient.  On reevaluation patient feels slightly improved.  I think the burning in his chest is secondary more to reflux disease, considered cholecystitis but is no Murphy sign, no leukocytosis, very mild transaminitis.  I think that is less likely.  Will start on antacids, have him follow-up  with his PCP next week for reevaluation.  I discussed return precautions with the patient who verbalized understanding, discharged in stable condition.        Final Clinical Impression(s) / ED Diagnoses Final diagnoses:  None    Rx / DC Orders ED Discharge Orders     None         Sherrill Raring, Hershal Coria 08/01/22 2145    Sherwood Gambler, MD 08/01/22 [phone]

## 2022-08-01 NOTE — Discharge Instructions (Signed)
You are seen today in the emergency department for abdominal pain.  Your workup today was reassuring, I suspect that the limit of the presentation is due to reflux.  Take the Pepcid as prescribed for the next 4 weeks.  Call and schedule follow-up with your doctor next week.  Return to the ED if you have any significant right upper quadrant pain which is constant, associate with vomiting, fevers, new or concerning or worsening symptoms.

## 2023-08-05 ENCOUNTER — Ambulatory Visit: Payer: 59 | Admitting: Family Medicine

## 2023-11-02 ENCOUNTER — Ambulatory Visit: Payer: Self-pay | Admitting: Family Medicine

## 2024-01-25 ENCOUNTER — Ambulatory Visit: Admitting: Family Medicine
